# Patient Record
Sex: Female | Born: 1945 | Race: Black or African American | Hispanic: No | Marital: Married | Smoking: Never smoker
Health system: Southern US, Community
[De-identification: ages and names within clinical notes are randomized; demographics above are authoritative.]

## PROBLEM LIST (undated history)

## (undated) DIAGNOSIS — R9389 Abnormal findings on diagnostic imaging of other specified body structures: Secondary | ICD-10-CM

## (undated) DIAGNOSIS — E785 Hyperlipidemia, unspecified: Secondary | ICD-10-CM

## (undated) DIAGNOSIS — R918 Other nonspecific abnormal finding of lung field: Secondary | ICD-10-CM

## (undated) DIAGNOSIS — E042 Nontoxic multinodular goiter: Secondary | ICD-10-CM

## (undated) DIAGNOSIS — C50919 Malignant neoplasm of unspecified site of unspecified female breast: Secondary | ICD-10-CM

## (undated) DIAGNOSIS — R200 Anesthesia of skin: Secondary | ICD-10-CM

## (undated) DIAGNOSIS — I10 Essential (primary) hypertension: Secondary | ICD-10-CM

## (undated) DIAGNOSIS — M9979 Connective tissue and disc stenosis of intervertebral foramina of abdomen and other regions: Secondary | ICD-10-CM

## (undated) HISTORY — PX: ABDOMINAL HYSTERECTOMY: SHX81

## (undated) HISTORY — DX: Nontoxic multinodular goiter: E04.2

## (undated) HISTORY — DX: Anesthesia of skin: R20.0

## (undated) HISTORY — DX: Other nonspecific abnormal finding of lung field: R91.8

## (undated) HISTORY — DX: Abnormal findings on diagnostic imaging of other specified body structures: R93.89

## (undated) HISTORY — PX: FOOT ARTHRODESIS, MODIFIED MCBRIDE: SUR52

## (undated) HISTORY — DX: Connective tissue and disc stenosis of intervertebral foramina of abdomen and other regions: M99.79

## (undated) HISTORY — DX: Hyperlipidemia, unspecified: E78.5

## (undated) HISTORY — PX: COLONOSCOPY: SHX174

## (undated) HISTORY — DX: Essential (primary) hypertension: I10

## (undated) HISTORY — PX: POLYPECTOMY: SHX149

## (undated) HISTORY — DX: Malignant neoplasm of unspecified site of unspecified female breast: C50.919

---

## 1999-04-15 ENCOUNTER — Encounter: Payer: Self-pay | Admitting: Emergency Medicine

## 1999-04-15 ENCOUNTER — Emergency Department (HOSPITAL_COMMUNITY): Admission: EM | Admit: 1999-04-15 | Discharge: 1999-04-15 | Payer: Self-pay | Admitting: Emergency Medicine

## 1999-11-10 ENCOUNTER — Encounter: Payer: Self-pay | Admitting: Family Medicine

## 1999-11-10 ENCOUNTER — Ambulatory Visit (HOSPITAL_COMMUNITY): Admission: RE | Admit: 1999-11-10 | Discharge: 1999-11-10 | Payer: Self-pay | Admitting: Family Medicine

## 1999-11-15 ENCOUNTER — Encounter: Payer: Self-pay | Admitting: Family Medicine

## 1999-11-15 ENCOUNTER — Ambulatory Visit (HOSPITAL_COMMUNITY): Admission: RE | Admit: 1999-11-15 | Discharge: 1999-11-15 | Payer: Self-pay | Admitting: Family Medicine

## 1999-12-07 ENCOUNTER — Encounter (INDEPENDENT_AMBULATORY_CARE_PROVIDER_SITE_OTHER): Payer: Self-pay | Admitting: Specialist

## 1999-12-07 ENCOUNTER — Other Ambulatory Visit: Admission: RE | Admit: 1999-12-07 | Discharge: 1999-12-07 | Payer: Self-pay | Admitting: Obstetrics and Gynecology

## 2000-02-21 ENCOUNTER — Inpatient Hospital Stay (HOSPITAL_COMMUNITY): Admission: RE | Admit: 2000-02-21 | Discharge: 2000-02-24 | Payer: Self-pay | Admitting: Obstetrics and Gynecology

## 2000-02-21 ENCOUNTER — Encounter (INDEPENDENT_AMBULATORY_CARE_PROVIDER_SITE_OTHER): Payer: Self-pay | Admitting: Specialist

## 2000-12-06 ENCOUNTER — Encounter: Payer: Self-pay | Admitting: Obstetrics and Gynecology

## 2000-12-06 ENCOUNTER — Ambulatory Visit (HOSPITAL_COMMUNITY): Admission: RE | Admit: 2000-12-06 | Discharge: 2000-12-06 | Payer: Self-pay | Admitting: Obstetrics and Gynecology

## 2000-12-11 ENCOUNTER — Encounter: Payer: Self-pay | Admitting: Obstetrics and Gynecology

## 2000-12-11 ENCOUNTER — Encounter: Admission: RE | Admit: 2000-12-11 | Discharge: 2000-12-11 | Payer: Self-pay | Admitting: Obstetrics and Gynecology

## 2001-12-24 ENCOUNTER — Other Ambulatory Visit: Admission: RE | Admit: 2001-12-24 | Discharge: 2001-12-24 | Payer: Self-pay | Admitting: Obstetrics and Gynecology

## 2001-12-31 ENCOUNTER — Encounter: Payer: Self-pay | Admitting: Obstetrics and Gynecology

## 2001-12-31 ENCOUNTER — Ambulatory Visit (HOSPITAL_COMMUNITY): Admission: RE | Admit: 2001-12-31 | Discharge: 2001-12-31 | Payer: Self-pay | Admitting: Obstetrics and Gynecology

## 2002-12-27 ENCOUNTER — Other Ambulatory Visit: Admission: RE | Admit: 2002-12-27 | Discharge: 2002-12-27 | Payer: Self-pay | Admitting: Obstetrics and Gynecology

## 2003-01-02 ENCOUNTER — Ambulatory Visit (HOSPITAL_COMMUNITY): Admission: RE | Admit: 2003-01-02 | Discharge: 2003-01-02 | Payer: Self-pay | Admitting: Obstetrics and Gynecology

## 2003-01-02 ENCOUNTER — Encounter: Payer: Self-pay | Admitting: Obstetrics and Gynecology

## 2004-01-15 ENCOUNTER — Other Ambulatory Visit: Admission: RE | Admit: 2004-01-15 | Discharge: 2004-01-15 | Payer: Self-pay | Admitting: Obstetrics and Gynecology

## 2005-05-30 ENCOUNTER — Other Ambulatory Visit: Admission: RE | Admit: 2005-05-30 | Discharge: 2005-05-30 | Payer: Self-pay | Admitting: Obstetrics and Gynecology

## 2005-05-31 ENCOUNTER — Other Ambulatory Visit: Admission: RE | Admit: 2005-05-31 | Discharge: 2005-05-31 | Payer: Self-pay | Admitting: Obstetrics and Gynecology

## 2005-07-05 ENCOUNTER — Encounter: Admission: RE | Admit: 2005-07-05 | Discharge: 2005-07-05 | Payer: Self-pay | Admitting: Obstetrics and Gynecology

## 2005-07-27 ENCOUNTER — Encounter: Admission: RE | Admit: 2005-07-27 | Discharge: 2005-07-27 | Payer: Self-pay | Admitting: Obstetrics and Gynecology

## 2005-09-16 ENCOUNTER — Ambulatory Visit: Payer: Self-pay | Admitting: Gastroenterology

## 2005-09-30 ENCOUNTER — Ambulatory Visit: Payer: Self-pay | Admitting: Gastroenterology

## 2005-09-30 ENCOUNTER — Encounter (INDEPENDENT_AMBULATORY_CARE_PROVIDER_SITE_OTHER): Payer: Self-pay | Admitting: *Deleted

## 2006-07-07 ENCOUNTER — Encounter: Admission: RE | Admit: 2006-07-07 | Discharge: 2006-07-07 | Payer: Self-pay | Admitting: Obstetrics and Gynecology

## 2006-07-19 ENCOUNTER — Encounter: Admission: RE | Admit: 2006-07-19 | Discharge: 2006-07-19 | Payer: Self-pay | Admitting: Obstetrics and Gynecology

## 2006-08-04 ENCOUNTER — Other Ambulatory Visit: Admission: RE | Admit: 2006-08-04 | Discharge: 2006-08-04 | Payer: Self-pay | Admitting: Obstetrics and Gynecology

## 2007-08-20 ENCOUNTER — Encounter: Admission: RE | Admit: 2007-08-20 | Discharge: 2007-08-20 | Payer: Self-pay | Admitting: Obstetrics and Gynecology

## 2010-08-08 ENCOUNTER — Encounter: Payer: Self-pay | Admitting: Obstetrics and Gynecology

## 2010-11-22 ENCOUNTER — Encounter: Payer: Self-pay | Admitting: Gastroenterology

## 2010-11-22 ENCOUNTER — Ambulatory Visit (AMBULATORY_SURGERY_CENTER): Payer: Commercial Managed Care - PPO | Admitting: *Deleted

## 2010-11-22 VITALS — Ht 62.0 in | Wt 167.0 lb

## 2010-11-22 DIAGNOSIS — Z860100 Personal history of colon polyps, unspecified: Secondary | ICD-10-CM

## 2010-11-22 MED ORDER — PEG-KCL-NACL-NASULF-NA ASC-C 100 G PO SOLR: ORAL | Status: DC

## 2010-12-01 ENCOUNTER — Other Ambulatory Visit: Payer: Self-pay | Admitting: Adult Health Nurse Practitioner

## 2010-12-01 DIAGNOSIS — Z139 Encounter for screening, unspecified: Secondary | ICD-10-CM

## 2010-12-03 ENCOUNTER — Ambulatory Visit
Admission: RE | Admit: 2010-12-03 | Discharge: 2010-12-03 | Disposition: A | Discharge status: Home / Self Care | Payer: Commercial Managed Care - PPO | Source: Ambulatory Visit | Attending: Adult Health Nurse Practitioner | Admitting: Adult Health Nurse Practitioner

## 2010-12-03 DIAGNOSIS — Z139 Encounter for screening, unspecified: Secondary | ICD-10-CM

## 2010-12-03 NOTE — Op Note (Signed)
[facility]  Patient:    Pamela Small, Pamela Small                       MRN: [id number] Proc. Date: 02/21/00 Attending:  Fayrene Fearing A. Ashley Royalty, M.D.                           Operative Report  PREOPERATIVE DIAGNOSES: 1. Fibroid uterus. 2. Intermenstrual bleeding, menorrhagia secondary to #1. 3. Anemia--secondary to #1 and #2.  POSTOPERATIVE DIAGNOSES: 1. Fibroid uterus. 2. Intermenstrual bleeding, menorrhagia secondary to #1. 3. Anemia--secondary to #1 and #2.  PATH:  Pending.  PROCEDURE:  Total abdominal hysterectomy, bilateral salpingo-oophorectomy.  SURGEON:  Rudy Jew. Ashley Royalty, M.D.  ASSISTANT:  Altamese Dilling, M.D.  ANESTHESIA:  General.  ESTIMATED BLOOD LOSS:  1500 cc.  COMPLICATIONS:  None.  PACKS/DRAINS:  None.  DESCRIPTION OF PROCEDURE:  The patient was taken to the operating room and placed in the dorsal supine position. After adequate general anesthesia was administered she was placed in the frog leg position and prepped and draped in the usual manner for abdominal surgery including prepping of the vagina. A Foley catheter was placed. The patient was then returned to the supine position.  A Pfannenstiel incision was made down to the level of the fascia. The fascia was nicked with a knife and incised transversely with Mayo scissors. The underlying rectus muscles were separated from the overlying fascia using sharp and blunt dissection. The rectus muscles were separated in the midline exposing the peritoneum which was elevated and entered atraumatically with Metzenbaum scissors. The incision was extended longitudinally. 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 The adnexa appeared normal. Pelvic washings were obtained prior to further manipulation. It was submitted separately to pathology for cytologic studies.  The round ligaments were then clamped, cut and secured with #1 chromic catgut. A bladder flap was created anteriorly and the  bladder was sharply and bluntly dissected inferiorly out of harms way. Next, the broad ligament was further opened and the incision carried up to the level of the adnexa. The infundibulopelvic ligaments were bilaterally clamped, cut and doubly secured with #1 chromic catgut. Next, the uterine vessels were skeletonized and the uterine vessels were clamped, cut and doubly secured with #1 chromic catgut. At this point, the dissection could not be further carried out due to the massive fibroid uterus, hence the uterus was excised at the level of the corpus and submitted to pathology for histologic studies. Next, the cervix was grasped with Satinsky scissors and the dissection carried further down by taking serial bites with straight Heaney clamps. Each pedicle was clamped, cut and sutures with #1 chromic catgut. The vagina was successfully entered and the cervix was excised with Satinsky scissors. It was then submitted to pathology for histologic studies. The vaginal angles were secured with #1 chromic catgut. A running locking circumferential suture was placed on the cervix using 2-0 Vicryl to aid in hemostasis. The diameter was sufficiently small at the conclusion of the suture placement. No additional sutures were felt to be necessary to avoid any herniation. Copious irrigation was accomplished. Due to the difficulty with the dissection and the large fibroid uterus, the blood loss was considerably more than average. At the conclusion of the blood loss, the patient was given 1 unit of her autologous blood. Copious irrigation was accomplished and hemostasis noted. Next, the fascial defects were closed with #0 Vicryl in a running fashion. The skin was  closed with staples. The patient tolerated the procedure extremely well and was returned to the recovery room in good condition. DD:  02/22/00 TD:  02/22/00 Job: [id number]

## 2010-12-03 NOTE — Discharge Summary (Signed)
[facility]  Patient:    Pamela Small, Pamela Small                       MRN: [id number] Attending:  Rudy Jew. Ashley Royalty, M.D.                           Discharge Summary  HISTORY OF PRESENT ILLNESS:  This is a 65 year old gravida 2, para 1, Ab1, referred through the courtesy of Lilyan Punt. Sydnee Levans, M.D. at [facility].  Patient presented on or about Dec 03, 1999 complaining of intermenstrual bleeding for approximately one year as well as menorrhagia and frequent dyspareunia.  She had a GYN ultrasound at [facility] on November 15, 1999 which revealed a fibroid uterus, including one 5.6 cm in diameter and several others, the larger of which was 3.4 cm in greatest diameter.  The right ovary was not visualized; the left ovary was considered normal.  Subsequent endometrial biopsy was performed which revealed weakly proliferative endometrium with no hyperplasia or malignancy.  Patient was also subsequently diagnosed with anemia, with hemoglobin 11.4, hematocrit 36.6 and MCHC 31.1 and MCV 80.8.  She is hence for a total abdominal hysterectomy, bilateral salpingo-oophorectomy and indicated procedures.  MEDICATIONS:  None.  PAST MEDICAL HISTORY:  Negative.  PAST SURGICAL HISTORY:  Biopsy of the left breast some 20 years ago with benign pathology.  Surgery on foot 20 years ago.  ALLERGIES:  None.  FAMILY HISTORY:  Noncontributory.  SOCIAL HISTORY:  Patient denies the use of tobacco or significant alcohol.  REVIEW OF SYSTEMS:  Noncontributory.  PHYSICAL EXAMINATION:  GENERAL:  Well-developed, well-nourished, pleasant black female in no acute distress.  Afebrile.  VITAL SIGNS:  Vital signs stable.  SKIN:  Warm and dry without lesions.  LYMPH:  There is no supraclavicular, cervical or inguinal adenopathy.  HEENT:  Normocephalic.  NECK:  Supple without thyromegaly.  CHEST:  Lungs are clear.  CARDIAC:  Regular rate and rhythm, without  murmurs, gallops or rubs.  BREASTS:  Deferred.  ABDOMEN:  Soft and nontender, without masses or organomegaly.  PELVIC:  Examination deferred until examination under anesthesia.  IMPRESSION 1. Fibroid uterus. 2. Abnormal uterine bleeding -- secondary to #1. 3. Anemia -- probably secondary to #1 and #2.  PLAN:  Total abdominal hysterectomy, bilateral salpingo-oophorectomy and indicated procedures.  Risks, benefits, complications and alternatives were fully discussed with the patient.  Patient states she understands and accepts. Questions invited and answered. DD:  02/20/00 TD:  02/21/00 Job: [id number]

## 2010-12-03 NOTE — Discharge Summary (Signed)
[facility]  Patient:    Pamela Small, Pamela Small                       MRN: [id number] Adm. Date:  [id number] Disc. Date: [id number] Attending:  Wandalee Ferdinand                           Discharge Summary  DISCHARGE DIAGNOSES: 1. Fibroid uterus. 2. Anemia secondary to #1 plus intraoperative blood loss.  OPERATIONS AND SPECIAL PROCEDURES:  Total abdominal hysterectomy, bilateral salpingo-oophorectomy.  HOSPITAL COURSE:  The patient was admitted on the aforementioned date and underwent hysterectomy as stated.  She tolerated the procedure well.  Her postoperative course was uneventful, save for postoperative anemia.  She was given autologous blood intraoperatively as well as bank blood postoperatively and at the time of discharge hemoglobin was approximately 8.7.  DISCHARGE MEDICATIONS:  Estrace 1 mg daily, Tylox for pain and Clomagen daily for anemia.  DISPOSITION:  The patient is to return to [facility] approximately six weeks for postoperative follow-up. DD:  03/22/00 TD:  03/22/00 Job: [id number]

## 2010-12-06 ENCOUNTER — Encounter: Payer: Self-pay | Admitting: Gastroenterology

## 2010-12-06 ENCOUNTER — Other Ambulatory Visit: Payer: Self-pay | Admitting: Gastroenterology

## 2010-12-06 ENCOUNTER — Ambulatory Visit (AMBULATORY_SURGERY_CENTER): Payer: Commercial Managed Care - PPO | Admitting: Gastroenterology

## 2010-12-06 VITALS — BP 142/74 | HR 86 | Temp 97.2°F | Resp 20 | Ht 62.5 in | Wt 167.0 lb

## 2010-12-06 DIAGNOSIS — Z860100 Personal history of colon polyps, unspecified: Secondary | ICD-10-CM

## 2010-12-06 DIAGNOSIS — Z1211 Encounter for screening for malignant neoplasm of colon: Secondary | ICD-10-CM

## 2010-12-06 MED ORDER — SODIUM CHLORIDE 0.9 % IV SOLN: 500.0000 mL | INTRAVENOUS | Status: DC

## 2010-12-06 NOTE — Patient Instructions (Signed)
NORMAL COLONOSCOPY- REPEAT EXAM IN 10 YEARS, WE WILL MAIL YOU A LETTER TO REMIND YOU OF THIS  CONTINUE ALL MEDICINES AS DIRECTED

## 2010-12-07 ENCOUNTER — Telehealth: Payer: Self-pay

## 2010-12-07 NOTE — Telephone Encounter (Signed)
Left message on answering machine. 

## 2011-01-15 ENCOUNTER — Emergency Department (HOSPITAL_COMMUNITY)
Admission: EM | Admit: 2011-01-15 | Discharge: 2011-01-15 | Disposition: A | Discharge status: Home / Self Care | Payer: PRIVATE HEALTH INSURANCE | Attending: Emergency Medicine | Admitting: Emergency Medicine

## 2011-01-15 DIAGNOSIS — X19XXXA Contact with other heat and hot substances, initial encounter: Secondary | ICD-10-CM | POA: Insufficient documentation

## 2011-01-15 DIAGNOSIS — Y990 Civilian activity done for income or pay: Secondary | ICD-10-CM | POA: Insufficient documentation

## 2011-01-15 DIAGNOSIS — Y9269 Other specified industrial and construction area as the place of occurrence of the external cause: Secondary | ICD-10-CM | POA: Insufficient documentation

## 2011-01-15 DIAGNOSIS — T2122XA Burn of second degree of abdominal wall, initial encounter: Secondary | ICD-10-CM | POA: Insufficient documentation

## 2011-01-15 DIAGNOSIS — E7800 Pure hypercholesterolemia, unspecified: Secondary | ICD-10-CM | POA: Insufficient documentation

## 2012-05-10 ENCOUNTER — Ambulatory Visit (INDEPENDENT_AMBULATORY_CARE_PROVIDER_SITE_OTHER): Payer: Medicare Other | Admitting: Family Medicine

## 2012-05-10 ENCOUNTER — Emergency Department (HOSPITAL_COMMUNITY): Payer: Medicare Other

## 2012-05-10 ENCOUNTER — Encounter (HOSPITAL_COMMUNITY): Payer: Self-pay | Admitting: *Deleted

## 2012-05-10 ENCOUNTER — Ambulatory Visit: Payer: Medicare Other

## 2012-05-10 ENCOUNTER — Other Ambulatory Visit: Payer: Self-pay

## 2012-05-10 ENCOUNTER — Observation Stay (HOSPITAL_COMMUNITY)
Admission: EM | Admit: 2012-05-10 | Discharge: 2012-05-11 | Disposition: A | Discharge status: Home / Self Care | Payer: Medicare Other | Attending: Emergency Medicine | Admitting: Emergency Medicine

## 2012-05-10 VITALS — BP 148/82 | HR 81 | Temp 98.2°F | Resp 17 | Ht 65.0 in | Wt 164.0 lb

## 2012-05-10 DIAGNOSIS — I10 Essential (primary) hypertension: Secondary | ICD-10-CM | POA: Insufficient documentation

## 2012-05-10 DIAGNOSIS — J45909 Unspecified asthma, uncomplicated: Secondary | ICD-10-CM | POA: Insufficient documentation

## 2012-05-10 DIAGNOSIS — E785 Hyperlipidemia, unspecified: Secondary | ICD-10-CM

## 2012-05-10 DIAGNOSIS — Z862 Personal history of diseases of the blood and blood-forming organs and certain disorders involving the immune mechanism: Secondary | ICD-10-CM

## 2012-05-10 DIAGNOSIS — R079 Chest pain, unspecified: Secondary | ICD-10-CM

## 2012-05-10 DIAGNOSIS — Z79899 Other long term (current) drug therapy: Secondary | ICD-10-CM | POA: Insufficient documentation

## 2012-05-10 DIAGNOSIS — Z8639 Personal history of other endocrine, nutritional and metabolic disease: Secondary | ICD-10-CM

## 2012-05-10 LAB — TSH: TSH: 1.19 u[IU]/mL (ref 0.350–4.500)

## 2012-05-10 LAB — POCT CBC
Granulocyte percent: 65 % (ref 37–80)
HCT, POC: 49.3 % — AB (ref 37.7–47.9)
Hemoglobin: 14.9 g/dL (ref 12.2–16.2)
Lymph, poc: 2 (ref 0.6–3.4)
MCH, POC: 26.9 pg — AB (ref 27–31.2)
MCHC: 30.2 g/dL — AB (ref 31.8–35.4)
MCV: 89.2 fL (ref 80–97)
MID (cbc): 0.5 (ref 0–0.9)
MPV: 9.1 fL (ref 0–99.8)
POC Granulocyte: 4.7 (ref 2–6.9)
POC LYMPH PERCENT: 27.8 %L (ref 10–50)
POC MID %: 7.2 % (ref 0–12)
Platelet Count, POC: 342 10*3/uL (ref 142–424)
RBC: 5.53 M/uL — AB (ref 4.04–5.48)
RDW, POC: 15 %
WBC: 7.2 10*3/uL (ref 4.6–10.2)

## 2012-05-10 LAB — LIPID PANEL
Cholesterol: 347 mg/dL — ABNORMAL HIGH (ref 0–200)
HDL: 46 mg/dL (ref >39)
LDL Cholesterol: 276 mg/dL — ABNORMAL HIGH (ref 0–99)
Total CHOL/HDL Ratio: 7.5 Ratio
Triglycerides: 123 mg/dL (ref <150)
VLDL: 25 mg/dL (ref 0–40)

## 2012-05-10 LAB — CBC
MCHC: 34 g/dL (ref 30.0–36.0)
MCV: 84.5 fL (ref 78.0–100.0)
Platelets: 270 10*3/uL (ref 150–400)
RDW: 14.3 % (ref 11.5–15.5)
WBC: 7.6 10*3/uL (ref 4.0–10.5)

## 2012-05-10 LAB — TROPONIN I: Troponin I: <0.3 ng/mL (ref <0.30)

## 2012-05-10 LAB — COMPREHENSIVE METABOLIC PANEL WITH GFR
ALT: 15 U/L (ref 0–35)
Albumin: 4.4 g/dL (ref 3.5–5.2)
CO2: 28 meq/L (ref 19–32)
Calcium: 9.7 mg/dL (ref 8.4–10.5)
Chloride: 102 meq/L (ref 96–112)
Potassium: 4.6 meq/L (ref 3.5–5.3)
Sodium: 137 meq/L (ref 135–145)
Total Protein: 7.2 g/dL (ref 6.0–8.3)

## 2012-05-10 LAB — COMPREHENSIVE METABOLIC PANEL
AST: 19 U/L (ref 0–37)
Alkaline Phosphatase: 132 U/L — ABNORMAL HIGH (ref 39–117)
BUN: 16 mg/dL (ref 6–23)
Creat: 0.87 mg/dL (ref 0.50–1.10)
Glucose, Bld: 90 mg/dL (ref 70–99)
Total Bilirubin: 0.7 mg/dL (ref 0.3–1.2)

## 2012-05-10 LAB — BASIC METABOLIC PANEL
Chloride: 101 mEq/L (ref 96–112)
Creatinine, Ser: 0.92 mg/dL (ref 0.50–1.10)
GFR calc Af Amer: 74 mL/min — ABNORMAL LOW (ref >90)
GFR calc non Af Amer: 63 mL/min — ABNORMAL LOW (ref >90)
Potassium: 4.7 mEq/L (ref 3.5–5.1)

## 2012-05-10 LAB — POCT I-STAT TROPONIN I: Troponin i, poc: 0.01 ng/mL (ref 0.00–0.08)

## 2012-05-10 LAB — POCT GLYCOSYLATED HEMOGLOBIN (HGB A1C): Hemoglobin A1C: 5.7

## 2012-05-10 MED ORDER — ASPIRIN 325 MG PO TABS
325.0000 mg | ORAL_TABLET | Freq: Every day | ORAL | Status: DC
  Administered 2012-05-10: 325 mg via ORAL

## 2012-05-10 MED ORDER — ACETAMINOPHEN 325 MG PO TABS
650.0000 mg | ORAL_TABLET | Freq: Once | ORAL | Status: AC
  Administered 2012-05-10: 650 mg via ORAL
  Filled 2012-05-10: qty 2

## 2012-05-10 MED ORDER — ALBUTEROL SULFATE (5 MG/ML) 0.5% IN NEBU
5.0000 mg | INHALATION_SOLUTION | Freq: Once | RESPIRATORY_TRACT | Status: AC
  Administered 2012-05-10: 5 mg via RESPIRATORY_TRACT
  Filled 2012-05-10: qty 1

## 2012-05-10 NOTE — ED Provider Notes (Signed)
History     CSN: [id number]  Arrival date & time 05/10/12  1122   First MD Initiated Contact with Patient 05/10/12 1145      Chief Complaint  Patient presents with  . Chest Pain  . Hypertension    (Consider location/radiation/quality/duration/timing/severity/associated sxs/prior treatment) HPI Comments: Patient presents to the emergency department with complaint of elevated blood pressure for the past couple of weeks. Patient states she does not have a history of hypertension. She has a history of high cholesterol. Patient also states that she intermittently has short-lived episodes of sharp mid-chest pain that lasts for several seconds before resolving completely. In addition, she reports having chest pressure that is constant over her chest for the past week. She has had these for some time, most recently 2 days ago. This is not aggravated by activity or movement. Patient does not have any history of cardiac problems and has never had a stress test. Patient was seen by her primary care physician today for these complaints and was sent to the emergency department for further evaluation, specifically given CP and risk factors. Onset of chest pain is acute. Course is resolved. Nothing makes symptoms better or worse.  The history is provided by the patient.    Past Medical History  Diagnosis Date  . Hyperlipidemia   . Asthma     Past Surgical History  Procedure Date  . Abdominal hysterectomy   . Colonoscopy   . Polypectomy   . Foot arthrodesis, modified mcbride     Family History  Problem Relation Age of Onset  . Breast cancer Daughter   . Hyperlipidemia Mother   . Diabetes Maternal Grandmother     History  Substance Use Topics  . Smoking status: Never Smoker   . Smokeless tobacco: Not on file  . Alcohol Use: No    OB History    Grav Para Term Preterm Abortions TAB SAB Ect Mult Living                  Review of Systems  Constitutional: Negative for fever and  diaphoresis.  HENT: Negative for neck pain.   Eyes: Negative for redness.  Respiratory: Positive for cough, shortness of breath and wheezing.   Cardiovascular: Positive for chest pain. Negative for palpitations and leg swelling.  Gastrointestinal: Negative for nausea, vomiting and abdominal pain.  Genitourinary: Negative for dysuria.  Musculoskeletal: Negative for back pain.  Skin: Negative for rash.  Neurological: Negative for syncope and light-headedness.    Allergies  Review of patient's allergies indicates no known allergies.  Home Medications   Current Outpatient Rx  Name Route Sig Dispense Refill  . PEG-KCL-NACL-NASULF-NA ASC-C 100 G PO SOLR  Moviprep-Take as directed 1 kit 0    Dispense as written.  Marland Kitchen ROSUVASTATIN CALCIUM 20 MG PO TABS Oral Take 20 mg by mouth daily.        BP 128/81  Pulse 77  Temp 98.1 F (36.7 C) (Oral)  Resp 16  Ht 5' 2.5" (1.588 m)  Wt 164 lb (74.39 kg)  BMI 29.52 kg/m2  SpO2 100%  Physical Exam  Nursing note and vitals reviewed. Constitutional: She appears well-developed and well-nourished.  HENT:  Head: Normocephalic and atraumatic.  Mouth/Throat: Mucous membranes are normal. Mucous membranes are not dry.  Eyes: Conjunctivae normal are normal.  Neck: Trachea normal and normal range of motion. Neck supple. Normal carotid pulses and no JVD present. No muscular tenderness present. Carotid bruit is not present. No tracheal deviation present.  Cardiovascular: Normal rate, regular rhythm, S1 normal, S2 normal, normal heart sounds and intact distal pulses.  Exam reveals no decreased pulses.   No murmur heard. Pulmonary/Chest: Effort normal. No respiratory distress. She has wheezes (Moderate exp all fields. ). She exhibits no tenderness.  Abdominal: Soft. Normal aorta and bowel sounds are normal. There is no tenderness. There is no rebound and no guarding.  Musculoskeletal: Normal range of motion.  Neurological: She is alert.  Skin: Skin is warm  and dry. She is not diaphoretic. No cyanosis. No pallor.  Psychiatric: She has a normal mood and affect.    ED Course  Procedures (including critical care time)  Labs Reviewed  CBC - Abnormal; Notable for the following:    RBC 5.22 (*)     All other components within normal limits  BASIC METABOLIC PANEL - Abnormal; Notable for the following:    GFR calc non Af Amer 63 (*)     GFR calc Af Amer 74 (*)     All other components within normal limits  POCT I-STAT TROPONIN I      1. Chest pain     11:57 AM Patient seen and examined. Work-up initiated. Patient has received ASA. I have reviewed Dr. Stevenson Clinch note from earlier today.    Vital signs reviewed and are as follows: Filed Vitals:   05/10/12 1131  BP: 128/81  Pulse: 77  Temp: 98.1 F (36.7 C)  Resp: 16   1:54 PM Patient discussed with Dr. Lorenso Courier who will see patient.   3:15 PM Dr. Lorenso Courier has seen. Will place on CDU CPP. Patient is agreeable. Handoff to Muthersbaugh PA-C in CDU.    Date: 05/10/2012  Rate: 73  Rhythm: normal sinus rhythm  QRS Axis: normal  Intervals: normal  ST/T Wave abnormalities: nonspecific T wave changes  Conduction Disutrbances:none  Narrative Interpretation:   Old EKG Reviewed: changes noted from 02/17/2000 lateral t-waves flat today    MDM  CDU CPP.         Renne Crigler, [place] 05/10/12 [phone]

## 2012-05-10 NOTE — Patient Instructions (Addendum)
Rejection of Medical Treatment, Against Medical Advice Medical examination, treatment, or testing has been recommended for me. I have decided to reject further treatment or medical evaluation, and will leave the facility. I am rejecting medical care of my own choice, and contrary to the instructions and wishes of __________________________________________________, the treating physician. I understand that permanent harm, or even death, can occur from failing to follow the recommendations of the physician. I have had an opportunity to ask questions and fully understand my medical condition. I have been advised that I may return at any time to continue medical evaluation or treatment.  Because I am rejecting recommended medical care, I agree to absolve and release the physician and the medical facility from any and all liabilities for damages arising from any current medical condition. I accept all risk associated with my medical condition, both known and unknown. I assume all responsibility for this action. I agree that I will make no claim of any nature against this medical facility or the physician under any circumstances. __________________________________________________ Name of Patient __________________________________________________ Signature of Patient or Guardian of Same __________________________________________________ Date Document Released: 07/04/2005 Document Revised: 09/26/2011 Document Reviewed: 02/21/2008 [facility] Patient Information 2013 [place], [place].

## 2012-05-10 NOTE — ED Notes (Signed)
Patient states she has had elevated bp for 2 weeks.  She has had chest pain,  Worse in the past 2 days.  She also reports neck pain and left arm pain.  Patient reports she has cough and sob as well with wheezing

## 2012-05-10 NOTE — Progress Notes (Signed)
[facility]:  Office Visit  Chief Complaint:  Chief Complaint  Patient presents with  . Hypertension  . Headache  . Chest Pain    HPI: Pamela Small is a 66 y.o. female who complains of 2 week h/o elevated BP as high as 197/94 and as low as 144/80 at home. She does not have a h/o HTN. She had 2 episodes of CP during this 2 week period while doing house work. She has had 2 episodes of left anterior substernal CP that was on left side with  left arm numbness, and stiff neck, and HA. She denies jaw claudication, denies vision problems, denies fevers, nausea and vomiting, SOB, abdominal pain, GERD. CP lasted several minutes, and then felt better after she sat down. She has a h/o poorly controlled hyperlipidemia. Last took medication for hyperlipidemia several months ago. Last episode of CP was 2 nights ago. She has a history of poorly controlled asthma as well. She has been wheezing,coughing a lot during these past 2 weeks. Ran out of albutero. Currently she is chest pain free. Mother with h/o heart disease s/p CHF and stent placement age 86.   Past Medical History  Diagnosis Date  . Hyperlipidemia   . Asthma    Past Surgical History  Procedure Date  . Abdominal hysterectomy   . Colonoscopy   . Polypectomy   . Foot arthrodesis, modified mcbride    History   Social History  . Marital Status: Married    Spouse Name: N/A    Number of Children: N/A  . Years of Education: N/A   Social History Main Topics  . Smoking status: Never Smoker   . Smokeless tobacco: None  . Alcohol Use: No  . Drug Use: No  . Sexually Active: None   Other Topics Concern  . None   Social History Narrative  . None   Family History  Problem Relation Age of Onset  . Breast cancer Daughter   . Hyperlipidemia Mother   . Diabetes Maternal Grandmother    No Known Allergies Prior to Admission medications   Medication Sig Start Date End Date Taking? Authorizing Provider  peg 3350  powder (MOVIPREP) 100 G SOLR Moviprep-Take as directed 11/22/10   Louis Meckel, MD  rosuvastatin (CRESTOR) 20 MG tablet Take 20 mg by mouth daily.      Historical Provider, MD     ROS: The patient denies fevers, night sweats, unintentional weight loss,  nausea, vomiting, abdominal pain, dysuria, hematuria, melena, weakness.  All other systems have been reviewed and were otherwise negative with the exception of those mentioned in the HPI and as above.    PHYSICAL EXAM: Filed Vitals:   05/10/12 0903  BP: 148/82  Pulse: 81  Temp: 98.2 F (36.8 C)  Resp: 17   Filed Vitals:   05/10/12 0903  Height: 5\' 5"  (1.651 m)  Weight: 164 lb (74.39 kg)   Body mass index is 27.29 kg/(m^2).  General: Alert, no acute distress HEENT:  Normocephalic, atraumatic, oropharynx patent. EOMI, PERRLA.  Cardiovascular:  Regular rate and rhythm, no rubs murmurs or gallops.  No Carotid bruits, radial pulse intact. No pedal edema.  Respiratory: Clear to auscultation bilaterally.  No wheezes, rales, or rhonchi.  No cyanosis, no use of accessory musculature GI: No organomegaly, abdomen is soft and non-tender, positive bowel sounds.  No masses. Skin: No rashes. Neurologic: Facial musculature symmetric. Psychiatric: Patient is appropriate throughout our interaction. Lymphatic: No cervical lymphadenopathy Musculoskeletal:  Gait intact.   LABS: Results for orders placed in visit on 05/10/12  POCT CBC      Component Value Range   WBC 7.2  4.6 - 10.2 K/uL   Lymph, poc 2.0  0.6 - 3.4   POC LYMPH PERCENT 27.8  10 - 50 %L   MID (cbc) 0.5  0 - 0.9   POC MID % 7.2  0 - 12 %M   POC Granulocyte 4.7  2 - 6.9   Granulocyte percent 65.0  37 - 80 %G   RBC 5.53 (*) 4.04 - 5.48 M/uL   Hemoglobin 14.9  12.2 - 16.2 g/dL   HCT, POC 04.5 (*) 40.9 - 47.9 %   MCV 89.2  80 - 97 fL   MCH, POC 26.9 (*) 27 - 31.2 pg   MCHC 30.2 (*) 31.8 - 35.4 g/dL   RDW, POC 81.1     Platelet Count, POC 342  142 - 424 K/uL   MPV 9.1  0 -  99.8 fL  POCT GLYCOSYLATED HEMOGLOBIN (HGB A1C)      Component Value Range   Hemoglobin A1C 5.7       EKG/XRAY:   Primary read interpreted by Dr. Conley Rolls at [facility]. No acute cardiopulmonary process.   ASSESSMENT/PLAN: Encounter Diagnoses  Name Primary?  . Chest pain Yes  . Hyperlipidemia   . History of diabetes mellitus    Exertional chest pain -cardiac in etiology? vs possible related to asthma but has enough risk factors needing evaluation.  Patient initially declined going to ER, explained risk and benefits. After speaking with her again and husband was present for last portion of visit they decided to go to [facility] ER . Patient given  ASA 325 mg in office. She will go by personal car and did not want to go by ambulance, risk and benefits explained to her. She will go now straight to ER for evaluation. I went ahead and got labs ie lipids, troponin, TSH and CMP in office.   Her former cardiologist was Dr. Annamary Carolin at [facility].   Spoke with [facility] nurse about patient going by private transportation by car.    Kramer Hanrahan PHUONG, DO 05/10/2012 11:13 AM

## 2012-05-10 NOTE — Progress Notes (Signed)
Utilization review completed.  

## 2012-05-10 NOTE — ED Provider Notes (Signed)
Patient moved to CDU under chest pain protocol. Patient resting comfortably at present without return of chest pain. Lungs with mild expiratory wheezing throughout, no rales or rhonchi. S1/S2, RRR, no murmur. Abdomen soft, bowel sounds present. Strong distal pulses palpated all extremities. Sinus rhythm on monitor without ectopy. Troponin negative x 2. Awaiting 3rd delta troponin at 1900.  12 lead reviewed, no indication of ischemia. Patient scheduled for Cardiac CT in AM. Diagnostic and treatment plan discussed with patient and she is amenable to plan.  I have ordered albuterol 5mg  for wheezing as patient has a Hx of asthma and has not had her medications today.    5:20 PM  Pt resting comfortably, in NAD.  Breath sounds clear and equal.  Pt reports resolution of wheezing.  Pt. C/o mild headache.  Will administer tylenol.  3rd Troponin remains negative.  Pt denies return of the chest pain or heaviness.    7:40 PM   Note: At D/C please prescribe an albuterol MDI with spacer.  Pt states she is supposed to be taking this, but does not because it coats her tongue when she uses the MDI.     Dahlia Client Ogle Hoeffner, PA-C 05/10/12 [phone]

## 2012-05-10 NOTE — ED Provider Notes (Signed)
Medical screening examination/treatment/procedure(s) were conducted as a shared visit with non-physician practitioner(s) and myself.  I personally evaluated the patient during the encounter.  Obtained H&P.  Pt's describes a transient chest pain that does not fit the classic story of heart related chest pain.  By [employer] criteria she is low risk for PE.  She does have several cardiac risk factors.  Plan is to place her on the CDU chest pain protocol.  Tobin Chad, MD 05/10/12 1725

## 2012-05-10 NOTE — ED Notes (Signed)
Care transferred and report given to Ginnie, RN 

## 2012-05-11 ENCOUNTER — Other Ambulatory Visit: Payer: Self-pay

## 2012-05-11 ENCOUNTER — Observation Stay (HOSPITAL_COMMUNITY): Payer: Medicare Other

## 2012-05-11 MED ORDER — METOPROLOL TARTRATE 1 MG/ML IV SOLN
INTRAVENOUS | Status: AC
  Filled 2012-05-11: qty 5

## 2012-05-11 MED ORDER — METOPROLOL TARTRATE 1 MG/ML IV SOLN
5.0000 mg | Freq: Once | INTRAVENOUS | Status: AC
  Administered 2012-05-11: 5 mg via INTRAVENOUS
  Filled 2012-05-11: qty 5

## 2012-05-11 MED ORDER — METOPROLOL TARTRATE 25 MG PO TABS
100.0000 mg | ORAL_TABLET | Freq: Once | ORAL | Status: AC
  Administered 2012-05-11: 100 mg via ORAL
  Filled 2012-05-11: qty 4

## 2012-05-11 MED ORDER — NITROGLYCERIN 0.4 MG SL SUBL
0.4000 mg | SUBLINGUAL_TABLET | Freq: Once | SUBLINGUAL | Status: AC
  Administered 2012-05-11: 0.4 mg via SUBLINGUAL

## 2012-05-11 MED ORDER — IOHEXOL 350 MG/ML SOLN
80.0000 mL | Freq: Once | INTRAVENOUS | Status: AC | PRN
  Administered 2012-05-11: 80 mL via INTRAVENOUS

## 2012-05-11 MED ORDER — METOPROLOL TARTRATE 1 MG/ML IV SOLN
5.0000 mg | Freq: Once | INTRAVENOUS | Status: AC
  Administered 2012-05-11: 5 mg via INTRAVENOUS

## 2012-05-11 MED ORDER — METOPROLOL TARTRATE 1 MG/ML IV SOLN
INTRAVENOUS | Status: AC
  Administered 2012-05-11: 5 mg via INTRAVENOUS
  Filled 2012-05-11: qty 5

## 2012-05-11 MED ORDER — NITROGLYCERIN 0.4 MG SL SUBL
SUBLINGUAL_TABLET | SUBLINGUAL | Status: AC
  Administered 2012-05-11: 0.4 mg via SUBLINGUAL
  Filled 2012-05-11: qty 25

## 2012-05-11 MED ORDER — METOPROLOL TARTRATE 1 MG/ML IV SOLN
INTRAVENOUS | Status: AC
  Filled 2012-05-11: qty 15

## 2012-05-11 NOTE — ED Notes (Signed)
Family at bedside. 

## 2012-05-11 NOTE — ED Notes (Signed)
Spoke with dr Joette Catching about pt heart rate. Orders received.

## 2012-05-11 NOTE — ED Provider Notes (Signed)
8:15: Patient is resting comfortably. In CDU on CPP, awaiting CT. Delay in metoprolol administration. No chest pain during the night or currently.   11:00 - still no pain. Resting comfortably.  1:30 - Dr. Llana Aliment called with report of mild CAD, non-obstructing, calc. Score of 58 (79th percentile). Discussed with Dr. Tenny Craw who will arrange office follow up with [facility] cardiology outpatient.  Rodena Medin, PA-C 05/11/12 1418

## 2012-05-11 NOTE — ED Notes (Signed)
bmi 29.8

## 2012-05-11 NOTE — ED Notes (Signed)
NPO now

## 2012-05-11 NOTE — ED Provider Notes (Signed)
Medical screening examination/treatment/procedure(s) were performed by non-physician practitioner and as supervising physician I was immediately available for consultation/collaboration.  Doug Sou, MD 05/11/12 2047

## 2012-05-11 NOTE — ED Provider Notes (Signed)
Medical screening examination/treatment/procedure(s) were performed by non-physician practitioner and as supervising physician I was immediately available for consultation/collaboration.   Trachelle Low H Khalidah Herbold, MD 05/11/12 1739 

## 2012-05-11 NOTE — ED Notes (Signed)
Patient is resting comfortably. 

## 2012-05-18 ENCOUNTER — Telehealth: Payer: Self-pay

## 2012-05-18 ENCOUNTER — Other Ambulatory Visit: Payer: Self-pay | Admitting: Family Medicine

## 2012-05-18 DIAGNOSIS — J9801 Acute bronchospasm: Secondary | ICD-10-CM

## 2012-05-18 DIAGNOSIS — I10 Essential (primary) hypertension: Secondary | ICD-10-CM

## 2012-05-18 DIAGNOSIS — E785 Hyperlipidemia, unspecified: Secondary | ICD-10-CM

## 2012-05-18 MED ORDER — ALBUTEROL SULFATE HFA 108 (90 BASE) MCG/ACT IN AERS: 2.0000 | INHALATION_SPRAY | Freq: Four times a day (QID) | RESPIRATORY_TRACT | Status: DC | PRN

## 2012-05-18 MED ORDER — ROSUVASTATIN CALCIUM 40 MG PO TABS: 40.0000 mg | ORAL_TABLET | Freq: Every day | ORAL | Status: DC

## 2012-05-18 MED ORDER — CHLORTHALIDONE 15 MG PO TABS: 15.0000 mg | ORAL_TABLET | Freq: Every day | ORAL | Status: DC

## 2012-05-18 NOTE — Telephone Encounter (Signed)
Spoke with patient regarding meds, labs. F/u in 1 month with BP cuff.

## 2012-05-18 NOTE — Telephone Encounter (Signed)
Spoke with pt, she states Dr Conley Rolls was supposed to send in her blood pressure medication the day she was in the office. She is at [facility] pharmacy and her medication is not there. Dr Conley Rolls please advise what BP med you would like pt to take.

## 2012-05-25 DIAGNOSIS — E785 Hyperlipidemia, unspecified: Secondary | ICD-10-CM | POA: Insufficient documentation

## 2012-05-27 ENCOUNTER — Encounter: Payer: Self-pay | Admitting: Family Medicine

## 2012-05-27 ENCOUNTER — Telehealth: Payer: Self-pay | Admitting: Physician Assistant

## 2012-05-27 MED ORDER — CHLORTHALIDONE 25 MG PO TABS: 12.5000 mg | ORAL_TABLET | Freq: Every day | ORAL | Status: DC

## 2012-05-27 NOTE — Telephone Encounter (Signed)
Received fax from [facility] pharmacy that chlorthalidone is only available in 25mg  or 50mg ; have changed her to 25mg  tabs with instructions to take one half tab daily

## 2012-06-04 ENCOUNTER — Encounter: Payer: Self-pay | Admitting: *Deleted

## 2012-06-04 ENCOUNTER — Encounter: Payer: Self-pay | Admitting: Cardiovascular Disease

## 2012-06-04 ENCOUNTER — Ambulatory Visit (INDEPENDENT_AMBULATORY_CARE_PROVIDER_SITE_OTHER): Payer: Medicare Other | Admitting: Cardiovascular Disease

## 2012-06-04 VITALS — BP 143/90 | HR 75 | Ht 62.5 in | Wt 166.0 lb

## 2012-06-04 DIAGNOSIS — R0600 Dyspnea, unspecified: Secondary | ICD-10-CM

## 2012-06-04 DIAGNOSIS — R0609 Other forms of dyspnea: Secondary | ICD-10-CM

## 2012-06-04 DIAGNOSIS — R079 Chest pain, unspecified: Secondary | ICD-10-CM | POA: Insufficient documentation

## 2012-06-04 DIAGNOSIS — J45909 Unspecified asthma, uncomplicated: Secondary | ICD-10-CM | POA: Insufficient documentation

## 2012-06-04 DIAGNOSIS — J449 Chronic obstructive pulmonary disease, unspecified: Secondary | ICD-10-CM

## 2012-06-04 DIAGNOSIS — R0989 Other specified symptoms and signs involving the circulatory and respiratory systems: Secondary | ICD-10-CM

## 2012-06-04 DIAGNOSIS — E785 Hyperlipidemia, unspecified: Secondary | ICD-10-CM

## 2012-06-04 DIAGNOSIS — I10 Essential (primary) hypertension: Secondary | ICD-10-CM | POA: Insufficient documentation

## 2012-06-04 MED ORDER — AMLODIPINE BESYLATE 5 MG PO TABS: 5.0000 mg | ORAL_TABLET | Freq: Every day | ORAL | Status: DC

## 2012-06-04 NOTE — Assessment & Plan Note (Signed)
Abnormal exam and CT with bronchiolitis and only an albuterol inhaler that she doesn't use well.  Refer to pulmonary

## 2012-06-04 NOTE — Patient Instructions (Addendum)
Your physician recommends that you schedule a follow-up appointment in: AS NEEDED Your physician has recommended you make the following change in your medication:  START AMLODIPIINE 5 MG 1 EVERY DAY  You have been referred to PULMONARY  DX COPD

## 2012-06-04 NOTE — Progress Notes (Signed)
Patient ID: Pamela Small, female   DOB: [birth year], 66 y.o.   MRN: [id number] Pamela Small is a 66 y.o. female who complains of 2 week h/o elevated BP as high as 197/94 and as low as 144/80 at home. She does not have a h/o HTN. She had 2 episodes of CP during this 2 week period while doing house work. She has had 2 episodes of left anterior substernal CP that was on left side with left arm numbness, and stiff neck, and HA. She denies jaw claudication, denies vision problems, denies fevers, nausea and vomiting, SOB, abdominal pain, GERD. CP lasted several minutes, and then felt better after she sat down. She has a h/o poorly controlled hyperlipidemia. Last took medication for hyperlipidemia several months ago. Last episode of CP was 2 nights ago. She has a history of poorly controlled asthma as well. She has been wheezing,coughing a lot during these past 2 weeks. Ran out of albutero. Currently she is chest pain free. Mother with h/o heart disease s/p CHF and stent placement age 37.  Had cardiac CT 10/24 with no CAD  Abnormal lung fields and fairly low calcium score in 50's  IMPRESSION:  1. Mild nonobstructive coronary artery disease, as detailed above. The patient's total coronary artery calcium score is 58 which is the 79th percentile for patient's of matched age, sex and race/ethnicity. 2. Multiple tiny pulmonary nodules scattered throughout the periphery of the lungs bilaterally are favored to reflect areas of mucoid impaction within terminal bronchioles. The largest of these measures up to 3 mm. If the patient is at high risk for bronchogenic carcinoma, follow-up chest CT at 1 year is recommended. If the patient is at low risk, no follow-up is needed. This recommendation follows the consensus statement: Guidelines for Management of Small Pulmonary Nodules Detected on CT Scans: A Statement from the Fleischner Society as published in Radiology 2005; 237:395-400. 3. Codominance of the  coronary arteries.    ROS: Denies fever, malais, weight loss, blurry vision, decreased visual acuity, cough, sputum, SOB, hemoptysis, pleuritic pain, palpitaitons, heartburn, abdominal pain, melena, lower extremity edema, claudication, or rash.  All other systems reviewed and negative   General: Affect appropriate Pleasant black female HEENT: normal Neck supple with no adenopathy JVP normal no bruits no thyromegaly Lungs bilateral rhonchi and  wheezing and good diaphragmatic motion Heart:  S1/S2 no murmur,rub, gallop or click PMI normal Abdomen: benighn, BS positve, no tenderness, no AAA no bruit.  No HSM or HJR Distal pulses intact with no bruits No edema Neuro non-focal Skin warm and dry No muscular weakness  Medications Current Outpatient Prescriptions  Medication Sig Dispense Refill  . albuterol (PROVENTIL HFA;VENTOLIN HFA) 108 (90 BASE) MCG/ACT inhaler Inhale 2 puffs into the lungs every 6 (six) hours as needed for wheezing.  1 Inhaler  5  . chlorthalidone (HYGROTON) 25 MG tablet Take 0.5 tablets (12.5 mg total) by mouth daily.  30 tablet  3  . rosuvastatin (CRESTOR) 40 MG tablet Take 1 tablet (40 mg total) by mouth daily. I changed your dosage because your cholesterol was still too high.  30 tablet  5   Current Facility-Administered Medications  Medication Dose Route Frequency Provider Last Rate Last Dose  . 0.9 %  sodium chloride infusion  500 mL Intravenous Continuous Louis Meckel, MD        Allergies Review of patient's allergies indicates no known allergies.  Family History: Family History  Problem Relation Age of Onset  . Breast  cancer Daughter   . Hyperlipidemia Mother   . Diabetes Maternal Grandmother     Social History: History   Social History  . Marital Status: Married    Spouse Name: N/A    Number of Children: N/A  . Years of Education: N/A   Occupational History  . Not on file.   Social History Main Topics  . Smoking status: Never  Smoker   . Smokeless tobacco: Not on file  . Alcohol Use: No  . Drug Use: No  . Sexually Active: Not on file   Other Topics Concern  . Not on file   Social History Narrative  . No narrative on file    Electrocardiogram:  05/11/12  SR rate 74 normal ECG  Assessment and Plan

## 2012-06-04 NOTE — Assessment & Plan Note (Signed)
Atypical related to wheezing and breathing issues.  Cardiac CT with no obstructive CAD

## 2012-06-04 NOTE — Assessment & Plan Note (Signed)
Cholesterol is not at goal.  Will need meds titrated by primary . Lab Results  Component Value Date   LDLCALC 276* 05/10/2012

## 2012-06-06 ENCOUNTER — Encounter: Payer: Self-pay | Admitting: Pulmonary Disease

## 2012-06-06 ENCOUNTER — Ambulatory Visit (INDEPENDENT_AMBULATORY_CARE_PROVIDER_SITE_OTHER): Payer: Medicare Other | Admitting: Pulmonary Disease

## 2012-06-06 VITALS — BP 132/84 | HR 82 | Temp 98.2°F | Ht 61.0 in | Wt 164.8 lb

## 2012-06-06 DIAGNOSIS — R918 Other nonspecific abnormal finding of lung field: Secondary | ICD-10-CM | POA: Insufficient documentation

## 2012-06-06 DIAGNOSIS — R062 Wheezing: Secondary | ICD-10-CM

## 2012-06-06 NOTE — Assessment & Plan Note (Signed)
The patient has 1-3 mm pulmonary nodules noted on her recent cardiac CT, and with a negative smoking history and no personal history of cancer, she needs no further followup by Fleischner criteria

## 2012-06-06 NOTE — Patient Instructions (Addendum)
Your noises are coming from your throat area and not the lungs.  I would like to schedule for scan of neck to see if anything is compressing your breathing passage, and also breathing tests to evaluate your upper and lower airway. Will call you with results once available.

## 2012-06-06 NOTE — Progress Notes (Signed)
  Subjective:    Patient ID: Pamela Small, female    DOB: 01/21/1946, 65 y.o.   MRN: [id number]  HPI The patient is a 66 year old female who I have been asked to see for wheezing.  The patient was told approximately one year ago that she had asthma after hearing wheezing, but the patient describes this as a classic audible upper airway pseudo-wheezing.  The noise can occur at any time during the day, but is worse upon lying down at night.  She has been started on albuterol as needed, but has not seen a big change with the medication.  She has never had pulmonary function studies.  She states that she has reasonable exertional tolerance unless she tries to walk up a large hill or multiple flights of stairs.  She is able to bring groceries in from the car unless she is taking more than 2 bags at a time.  However, she can walk on flat ground at a moderate pace for a mile or more without getting winded.  The patient denies any postnasal drip, but does have reflux symptoms for which she takes a proton pump inhibitor as needed.  She is also been newly diagnosed as having hypertension.  A recent chest x-ray showed no acute process, and minimal elevation of the right hemidiaphragm.  She has also had a cardiac CT that did demonstrate bilateral nodules 1-3 mm in size.  She has no smoking history, and no personal history of cancer.   Review of Systems  Constitutional: Negative for fever and unexpected weight change.  HENT: Positive for congestion. Negative for ear pain, nosebleeds, sore throat, rhinorrhea, sneezing, trouble swallowing, dental problem, postnasal drip and sinus pressure.   Eyes: Negative for redness and itching.  Respiratory: Positive for cough ( dry hacky cough.sometimes thick and white ), chest tightness, shortness of breath and wheezing.   Cardiovascular: Negative for palpitations and leg swelling.  Gastrointestinal: Negative for nausea and vomiting.  Genitourinary: Negative for dysuria.    Musculoskeletal: Negative for joint swelling.  Skin: Negative for rash.  Neurological: Positive for headaches.  Hematological: Bruises/bleeds easily.  Psychiatric/Behavioral: Negative for dysphoric mood. The patient is not nervous/anxious.        Objective:   Physical Exam Constitutional:  Well developed, no acute distress  HENT:  Nares patent without discharge  Oropharynx without exudate, palate and uvula are normal  Eyes:  Perrla, eomi, no scleral icterus  Neck:  No JVD, no TMG  Cardiovascular:  Normal rate, regular rhythm, no rubs or gallops.  No murmurs        Intact distal pulses  Pulmonary :  Prominent upper airway pseudowheezing on inspir and expir, no stridor    No rales, rhonchi, or wheezing.  Transmitted pseudowheezing to lower airways  Abdominal:  Soft, nondistended, bowel sounds present.  No tenderness noted.   Musculoskeletal:  No lower extremity edema noted.  Lymph Nodes:  No cervical lymphadenopathy noted  Skin:  No cyanosis noted  Neurologic:  Alert, appropriate, moves all 4 extremities without obvious deficit.         Assessment & Plan:

## 2012-06-06 NOTE — Assessment & Plan Note (Signed)
The patient has classic upper airway pseudo-wheezing, and it is unclear if this is a functional issue or a physical abnormality.  She does have prominent tissue in her neck on my exam, and it is unclear whether this is an enlarged thyroid, or possibly lymphadenopathy.  There is nothing on exam or history to suggest asthma.  I think she needs to have a CT of her neck to make sure there is not an anatomical reason for her symptoms, and if not, I would consider a functional issue possibly related to reflux disease.  She would also need an ENT evaluation to examine her vocal cord function.  However also like to check pulmonary function studies to look at her flow volume loop, and also to verify that her flows are normal.

## 2012-06-08 ENCOUNTER — Ambulatory Visit (INDEPENDENT_AMBULATORY_CARE_PROVIDER_SITE_OTHER)
Admission: RE | Admit: 2012-06-08 | Discharge: 2012-06-08 | Disposition: A | Discharge status: Home / Self Care | Payer: Medicare Other | Source: Ambulatory Visit | Attending: Pulmonary Disease | Admitting: Pulmonary Disease

## 2012-06-08 DIAGNOSIS — R062 Wheezing: Secondary | ICD-10-CM

## 2012-06-08 MED ORDER — IOHEXOL 300 MG/ML  SOLN
76.0000 mL | Freq: Once | INTRAMUSCULAR | Status: AC | PRN
  Administered 2012-06-08: 76 mL via INTRAVENOUS

## 2012-06-11 ENCOUNTER — Other Ambulatory Visit: Payer: Self-pay | Admitting: Pulmonary Disease

## 2012-06-11 DIAGNOSIS — R062 Wheezing: Secondary | ICD-10-CM

## 2012-06-12 ENCOUNTER — Telehealth: Payer: Self-pay | Admitting: Radiology

## 2012-06-12 ENCOUNTER — Telehealth: Payer: Self-pay | Admitting: Pulmonary Disease

## 2012-06-12 ENCOUNTER — Encounter: Payer: Self-pay | Admitting: Radiology

## 2012-06-12 DIAGNOSIS — E041 Nontoxic single thyroid nodule: Secondary | ICD-10-CM | POA: Insufficient documentation

## 2012-06-12 NOTE — Telephone Encounter (Signed)
Will forward to Dr. Shelle Iron so he is aware. Nothing further is needed at this time.

## 2012-06-12 NOTE — Telephone Encounter (Signed)
Pamela Small has the ct scan and was supposed to fax to the pt's primary md.  Will then need to call that office, speak directly with the MD's nurse, and verify that she received.  Then document accordingly in computer.  Pamela Small has the ct report with my notes on them to the primary MD>

## 2012-06-12 NOTE — Telephone Encounter (Signed)
Received a note from [place] stating that she received the ct report and Dr. Nedra Hai will handle ordering the [place].

## 2012-06-12 NOTE — Telephone Encounter (Signed)
I spoke with [facility] and was told to fax the report to [phone], attn:  Dr. Nedra Hai and nurse Amy. This has been faxed and I noted on the cover sheet that I would like for Amy to call me to confirm she has received this.

## 2012-06-12 NOTE — Progress Notes (Signed)
Quick Note:  Called and spoke with patient patient is aware of results/recs as listed below per Dr. Shelle Iron. Patient verbalized understanding of this and nothing further needed. ______

## 2012-06-12 NOTE — Telephone Encounter (Signed)
Hi, I got your fax, thank you for the CT report. We will contact patient and order the [place] of her thyroid. Thank you for letting us know. If you need anything further, please let me know. Amy/ Dr Conley Rolls.

## 2012-06-12 NOTE — Telephone Encounter (Signed)
Patient is at Dr Jearld Fenton office now and he is going to examine her vocal cords. I advised patient of the CT report and need for the [place] and Biopsy. Orders put in for this. Thank you. We need to keep an eye out for the biopsy report, sometimes this takes a while, Kay Ricciuti

## 2012-06-12 NOTE — Telephone Encounter (Signed)
Called and spoke with patient, patient was made aware of results/recs as listed below per Dr. Shelle Iron.  Patient verbalized understanding of this and per pt has her ENT appt set up. Nothing further needed at this time.  Patient states [facility].  Last MD she saw there was Dr. Nedra Hai.  Will forward to Dr. Shelle Iron

## 2012-06-12 NOTE — Telephone Encounter (Signed)
Lawson Fiscal has paperwork, will forward to [place] to further handle.  Thanks!!

## 2012-06-27 ENCOUNTER — Ambulatory Visit (INDEPENDENT_AMBULATORY_CARE_PROVIDER_SITE_OTHER): Payer: Medicare Other | Admitting: Pulmonary Disease

## 2012-06-27 DIAGNOSIS — R062 Wheezing: Secondary | ICD-10-CM

## 2012-06-27 DIAGNOSIS — R0600 Dyspnea, unspecified: Secondary | ICD-10-CM

## 2012-06-27 DIAGNOSIS — R0989 Other specified symptoms and signs involving the circulatory and respiratory systems: Secondary | ICD-10-CM

## 2012-06-27 DIAGNOSIS — R0609 Other forms of dyspnea: Secondary | ICD-10-CM

## 2012-06-27 DIAGNOSIS — R918 Other nonspecific abnormal finding of lung field: Secondary | ICD-10-CM

## 2012-06-27 LAB — PULMONARY FUNCTION TEST

## 2012-06-27 NOTE — Progress Notes (Signed)
PFT done today. 

## 2012-07-03 ENCOUNTER — Ambulatory Visit
Admission: RE | Admit: 2012-07-03 | Discharge: 2012-07-03 | Disposition: A | Discharge status: Home / Self Care | Payer: Medicare Other | Source: Ambulatory Visit | Attending: Family Medicine | Admitting: Family Medicine

## 2012-07-03 ENCOUNTER — Other Ambulatory Visit (HOSPITAL_COMMUNITY)
Admission: RE | Admit: 2012-07-03 | Discharge: 2012-07-03 | Disposition: A | Discharge status: Home / Self Care | Payer: Medicare Other | Source: Ambulatory Visit | Attending: Interventional Radiology | Admitting: Interventional Radiology

## 2012-07-03 DIAGNOSIS — E049 Nontoxic goiter, unspecified: Secondary | ICD-10-CM | POA: Insufficient documentation

## 2012-07-03 DIAGNOSIS — E041 Nontoxic single thyroid nodule: Secondary | ICD-10-CM

## 2012-07-05 ENCOUNTER — Telehealth: Payer: Self-pay | Admitting: Pulmonary Disease

## 2012-07-05 NOTE — Telephone Encounter (Signed)
Pt needs an ov to review her breathing tests with her. Also need copy of note from recent ENT evaluation.

## 2012-07-09 ENCOUNTER — Telehealth: Payer: Self-pay | Admitting: Family Medicine

## 2012-07-09 ENCOUNTER — Encounter: Payer: Self-pay | Admitting: Pulmonary Disease

## 2012-07-09 NOTE — Telephone Encounter (Addendum)
Pt has been scheduled for Wed., 07/25/12 @ 9:15 am with KC to discuss PFT results. Awaiting last OV note from Dr. Jearld Fenton. OV note from Dr. Jearld Fenton received and placed in [facility] look at folder (green).

## 2012-07-09 NOTE — Telephone Encounter (Signed)
Spoke to patient about thyroid results.

## 2012-07-20 ENCOUNTER — Ambulatory Visit: Payer: Medicare Other | Admitting: Pulmonary Disease

## 2012-07-25 ENCOUNTER — Encounter: Payer: Self-pay | Admitting: Pulmonary Disease

## 2012-07-25 ENCOUNTER — Ambulatory Visit (INDEPENDENT_AMBULATORY_CARE_PROVIDER_SITE_OTHER): Payer: Medicare HMO | Admitting: Pulmonary Disease

## 2012-07-25 VITALS — BP 110/78 | HR 66 | Temp 98.4°F | Ht 62.5 in | Wt 168.8 lb

## 2012-07-25 DIAGNOSIS — R0609 Other forms of dyspnea: Secondary | ICD-10-CM

## 2012-07-25 DIAGNOSIS — R0600 Dyspnea, unspecified: Secondary | ICD-10-CM

## 2012-07-25 DIAGNOSIS — R062 Wheezing: Secondary | ICD-10-CM

## 2012-07-25 MED ORDER — OMEPRAZOLE 40 MG PO CPDR: 40.0000 mg | DELAYED_RELEASE_CAPSULE | Freq: Two times a day (BID) | ORAL | Status: DC

## 2012-07-25 NOTE — Progress Notes (Signed)
  Subjective:    Patient ID: Pamela Small, female    DOB: 01-30-1946, 67 y.o.   MRN: [id number]  HPI Patient comes in today for followup of her complaint of wheezing and dyspnea.  At the last visit, she was felt to primarily have upper airway wheezing rather than lower.  She was scheduled for an evaluation of her neck with CT scanning, and also scheduled for pulmonary function studies.  The CT scan showed sinusitis, as well as a questionable abnormality in the piriform sinus.  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  She was treated with clindamycin, as well as b.i.d. Proton pump inhibitor.  She has had some improvement in her symptoms since that time.  Her PFTs surprisingly showed mild airflow obstruction with some air trapping, very mild restriction, as well as a mild reduction in her diffusion capacity.  Therefore, this is most consistent with the diagnosis of asthma.  She currently is not on inhaled corticosteroids.   Review of Systems  Constitutional: Negative for fever and unexpected weight change.  HENT: Positive for congestion and postnasal drip. Negative for ear pain, nosebleeds, sore throat, rhinorrhea, sneezing, trouble swallowing, dental problem and sinus pressure.   Eyes: Negative for redness and itching.  Respiratory: Positive for cough and wheezing. Negative for chest tightness and shortness of breath.   Cardiovascular: Negative for palpitations and leg swelling.  Gastrointestinal: Negative for nausea and vomiting.  Genitourinary: Negative for dysuria.  Musculoskeletal: Negative for joint swelling.  Skin: Negative for rash.  Neurological: Negative for headaches.  Hematological: Does not bruise/bleed easily.  Psychiatric/Behavioral: Negative for dysphoric mood. The patient is not nervous/anxious.        Objective:   Physical Exam Overweight female  in no acute distress Nose without purulent discharge noted Oropharynx clear Chest with loud upper airway pseudo-wheezing, excellent air flow, no true wheezing noted Cardiac exam is regular rate and rhythm Lower extremities with minimal edema, cyanosis Alert and oriented, moves all 4 extremities.       Assessment & Plan:

## 2012-07-25 NOTE — Assessment & Plan Note (Signed)
The patient clearly has a significant upper airway component to her wheezing.  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  She has finished up her antibiotics, and I would like her to stay on b.i.d. Proton pump inhibitor to help decrease upper airway inflammation.

## 2012-07-25 NOTE — Patient Instructions (Addendum)
Start qvar  2 inhalations am and pm everyday whether you need it or not.  Rinse mouth out well, gargle, and spit.  Do not use albuterol unless you need for rescue.  If you have been on qvar for 2 weeks, and still need to use albuterol more than twice a week, let me know.  Would take omeprazole 40mg , one in am and pm until next visit. followup with me in 4 weeks.

## 2012-07-25 NOTE — Assessment & Plan Note (Signed)
The patient has mild airflow obstruction on her PFTs, and based on history this is most consistent with asthma.  We will need to start her on an inhaled corticosteroid, and try and decrease her albuterol use.  I've had a long discussion with her about asthma, including the need to stay on medicines daily whether she thinks she needs it or not.

## 2012-07-26 ENCOUNTER — Encounter: Payer: Self-pay | Admitting: Pulmonary Disease

## 2012-08-09 ENCOUNTER — Other Ambulatory Visit: Payer: Self-pay | Admitting: Pulmonary Disease

## 2012-08-09 DIAGNOSIS — R0600 Dyspnea, unspecified: Secondary | ICD-10-CM

## 2012-08-21 ENCOUNTER — Telehealth: Payer: Self-pay | Admitting: Pulmonary Disease

## 2012-08-21 MED ORDER — BECLOMETHASONE DIPROPIONATE 80 MCG/ACT IN AERS: 2.0000 | INHALATION_SPRAY | Freq: Two times a day (BID) | RESPIRATORY_TRACT | Status: DC

## 2012-08-21 MED ORDER — BECLOMETHASONE DIPROPIONATE 80 MCG/ACT IN AERS: 1.0000 | INHALATION_SPRAY | Freq: Two times a day (BID) | RESPIRATORY_TRACT | Status: DC

## 2012-08-21 NOTE — Telephone Encounter (Signed)
Patient requesting Rx for QVar 80 sent to verified pharmacy.  Rx sent nothing further needed at this time, will route to [facility] as FYI.  Per 07/25/12 Office Note: Patient Instructions     Start qvar 2 inhalations am and pm everyday whether you need it or not. Rinse mouth out well, gargle, and spit.  Do not use albuterol unless you need for rescue. If you have been on qvar for 2 weeks, and still need to use albuterol more than twice a week, let me know.  Would take omeprazole 40mg , one in am and pm until next visit.  followup with me in 4 weeks.

## 2012-08-22 ENCOUNTER — Ambulatory Visit: Payer: Medicare HMO | Admitting: Pulmonary Disease

## 2012-08-29 ENCOUNTER — Ambulatory Visit: Payer: Medicare HMO | Admitting: Pulmonary Disease

## 2012-09-26 ENCOUNTER — Emergency Department (HOSPITAL_COMMUNITY): Payer: Medicare HMO

## 2012-09-26 ENCOUNTER — Emergency Department (HOSPITAL_COMMUNITY)
Admission: EM | Admit: 2012-09-26 | Discharge: 2012-09-26 | Disposition: A | Discharge status: Home / Self Care | Payer: Medicare HMO | Attending: Emergency Medicine | Admitting: Emergency Medicine

## 2012-09-26 ENCOUNTER — Encounter (HOSPITAL_COMMUNITY): Payer: Self-pay

## 2012-09-26 DIAGNOSIS — W010XXA Fall on same level from slipping, tripping and stumbling without subsequent striking against object, initial encounter: Secondary | ICD-10-CM | POA: Insufficient documentation

## 2012-09-26 DIAGNOSIS — Y9389 Activity, other specified: Secondary | ICD-10-CM | POA: Insufficient documentation

## 2012-09-26 DIAGNOSIS — Z79899 Other long term (current) drug therapy: Secondary | ICD-10-CM | POA: Insufficient documentation

## 2012-09-26 DIAGNOSIS — W1809XA Striking against other object with subsequent fall, initial encounter: Secondary | ICD-10-CM | POA: Insufficient documentation

## 2012-09-26 DIAGNOSIS — M79604 Pain in right leg: Secondary | ICD-10-CM

## 2012-09-26 DIAGNOSIS — Y9229 Other specified public building as the place of occurrence of the external cause: Secondary | ICD-10-CM | POA: Insufficient documentation

## 2012-09-26 DIAGNOSIS — IMO0002 Reserved for concepts with insufficient information to code with codable children: Secondary | ICD-10-CM | POA: Insufficient documentation

## 2012-09-26 DIAGNOSIS — E785 Hyperlipidemia, unspecified: Secondary | ICD-10-CM | POA: Insufficient documentation

## 2012-09-26 DIAGNOSIS — J45909 Unspecified asthma, uncomplicated: Secondary | ICD-10-CM | POA: Insufficient documentation

## 2012-09-26 DIAGNOSIS — W19XXXA Unspecified fall, initial encounter: Secondary | ICD-10-CM

## 2012-09-26 DIAGNOSIS — S8990XA Unspecified injury of unspecified lower leg, initial encounter: Secondary | ICD-10-CM | POA: Insufficient documentation

## 2012-09-26 MED ORDER — HYDROCODONE-ACETAMINOPHEN 5-325 MG PO TABS: 1.0000 | ORAL_TABLET | Freq: Four times a day (QID) | ORAL | Status: DC | PRN

## 2012-09-26 MED ORDER — HYDROCODONE-ACETAMINOPHEN 7.5-325 MG/15ML PO SOLN
10.0000 mL | Freq: Once | ORAL | Status: AC
  Administered 2012-09-26: 10 mL via ORAL
  Filled 2012-09-26: qty 15

## 2012-09-26 NOTE — ED Notes (Signed)
Pt states at [employer] slipped on soda on the floor and landed on lt knee, hit the cart with rt leg, pain and bruising to rt leg, states walking ok

## 2012-09-26 NOTE — ED Provider Notes (Signed)
History     CSN: [id number]  Arrival date & time 09/26/12  1019   First MD Initiated Contact with Patient 09/26/12 1021      Chief Complaint  Patient presents with  . Leg Pain  . Fall    (Consider location/radiation/quality/duration/timing/severity/associated sxs/prior treatment) HPI Comments: HARRIETT AZAR is a 67 y.o. female with a history of hyperlipidemia and asthma presents emergency department status post mechanical fall complaining of right lower extremity pain.  Onset of symptoms began just prior to arrival.  Pain is located in the anterior tib-fib area.  Pain severity rated at 6/10 without radiation and described as sharp throbbing.  Symptoms are moderate.  Exacerbating factors include use of limb and palpation.  Patient is able to bear weight.  No known alleviating factors have been attempted. Situation occurred at [facility] and patient describes slipping on soda while holding her grocery cart with point of impact being left knee and right leg sliding under cart with her shin hitting the lower grocery cart metal bar.  Height of fall was approximately 2 feet.  Patient denies hitting her head, any loss of consciousness, blood loss, syncope, disequilibrium or ataxia.  No other complaint at this time.  Patient is a 67 y.o. female presenting with leg pain and fall. The history is provided by the patient.  Leg Pain Associated symptoms: no fever and no neck pain   Fall Pertinent negatives include no fever and no headaches.    Past Medical History  Diagnosis Date  . Hyperlipidemia   . Asthma     Past Surgical History  Procedure Laterality Date  . Abdominal hysterectomy    . Colonoscopy    . Polypectomy    . Foot arthrodesis, modified mcbride      Family History  Problem Relation Age of Onset  . Breast cancer Daughter   . Hyperlipidemia Mother   . Diabetes Maternal Grandmother   . Asthma Mother     History  Substance Use Topics  . Smoking status: Never Smoker   .  Smokeless tobacco: Never Used  . Alcohol Use: No    OB History   Grav Para Term Preterm Abortions TAB SAB Ect Mult Living                  Review of Systems  Constitutional: Negative for fever, diaphoresis and activity change.  HENT: Negative for congestion and neck pain.   Respiratory: Negative for cough.   Genitourinary: Negative for dysuria.  Musculoskeletal: Negative for myalgias.  Skin: Negative for color change and wound.  Neurological: Negative for headaches.  All other systems reviewed and are negative.    Allergies  Review of patient's allergies indicates no known allergies.  Home Medications   Current Outpatient Rx  Name  Route  Sig  Dispense  Refill  . albuterol (PROVENTIL HFA;VENTOLIN HFA) 108 (90 BASE) MCG/ACT inhaler   Inhalation   Inhale 2 puffs into the lungs every 6 (six) hours as needed for wheezing.   1 Inhaler   5   . amLODipine (NORVASC) 5 MG tablet   Oral   Take 1 tablet (5 mg total) by mouth daily.   180 tablet   3   . beclomethasone (QVAR) 80 MCG/ACT inhaler   Inhalation   Inhale 2 puffs into the lungs 2 (two) times daily.   1 Inhaler   6   . omeprazole (PRILOSEC) 40 MG capsule   Oral   Take 1 capsule (40 mg total) by  mouth 2 (two) times daily.   60 capsule   6   . rosuvastatin (CRESTOR) 40 MG tablet   Oral   Take 1 tablet (40 mg total) by mouth daily. I changed your dosage because your cholesterol was still too high.   30 tablet   5     BP 156/89  Pulse 82  Temp(Src) 98.7 F (37.1 C) (Oral)  Resp 20  SpO2 95%  Physical Exam  Nursing note and vitals reviewed. Constitutional: She appears well-developed and well-nourished. No distress.  HENT:  Head: Normocephalic and atraumatic.  Eyes: Conjunctivae and EOM are normal.  Neck: Normal range of motion. Neck supple.  Cardiovascular:  Intact distal pulses, capillary refill < 3 seconds  Musculoskeletal:  Left knee without any bony ttp and full normal ROM. Right shin ttp with  mild anterior swelling. All other extremities with normal ROM  Neurological:  No sensory deficit  Skin: She is not diaphoretic.  Skin intact, no tenting.    ED Course  Procedures (including critical care time)  Labs Reviewed - No data to display Dg Tibia/fibula Right  09/26/2012  *RADIOLOGY REPORT*  Clinical Data: Fall, pain.  RIGHT TIBIA AND FIBULA - 2 VIEW  Comparison: None.  Findings: No acute bony or joint abnormality is identified.  There is some mild appearing degenerative change about the knee.  On the lateral view, radiopaque density projecting above the superior pole of the patella is seen and may be external to the patient.  IMPRESSION: No acute finding.   Original Report Authenticated By: Holley Dexter, M.D.      No diagnosis found.    MDM  67 year old female to emergency department for evaluation of right lower extremity status post mechanical fall.  Imaging of right tib-fib ordered as patient was tender to palpation with swelling and bruising to this area.  She was neurovascularly intact and there is low concern for orthopedic emergency.  Pain managed in the emergency department and ice applied.  Disposition pending results of imaging.  Patient X-Ray negative for obvious fracture or dislocation. Pt advised to follow up with orthopedics if symptoms persist for possibility of missed fracture diagnosis. Conservative therapy recommended and discussed. Patient will be dc home & is agreeable with above plan.         Jaci Carrel, [place] 09/26/12 1210

## 2012-09-27 NOTE — ED Provider Notes (Signed)
Medical screening examination/treatment/procedure(s) were performed by non-physician practitioner and as supervising physician I was immediately available for consultation/collaboration.  Tobin Chad, MD 09/27/12 1009

## 2012-10-08 ENCOUNTER — Other Ambulatory Visit: Payer: Self-pay | Admitting: Nurse Practitioner

## 2012-10-08 ENCOUNTER — Ambulatory Visit
Admission: RE | Admit: 2012-10-08 | Discharge: 2012-10-08 | Disposition: A | Discharge status: Home / Self Care | Payer: Medicare HMO | Source: Ambulatory Visit | Attending: Nurse Practitioner | Admitting: Nurse Practitioner

## 2012-10-08 DIAGNOSIS — R062 Wheezing: Secondary | ICD-10-CM

## 2012-10-08 DIAGNOSIS — R0602 Shortness of breath: Secondary | ICD-10-CM

## 2013-09-17 ENCOUNTER — Other Ambulatory Visit: Payer: Self-pay | Admitting: Internal Medicine

## 2013-09-17 ENCOUNTER — Ambulatory Visit
Admission: RE | Admit: 2013-09-17 | Discharge: 2013-09-17 | Disposition: A | Discharge status: Home / Self Care | Payer: Commercial Managed Care - HMO | Source: Ambulatory Visit | Attending: Internal Medicine | Admitting: Internal Medicine

## 2013-09-17 DIAGNOSIS — M25519 Pain in unspecified shoulder: Secondary | ICD-10-CM

## 2013-10-03 ENCOUNTER — Encounter: Payer: Self-pay | Admitting: Family Medicine

## 2013-10-03 DIAGNOSIS — E041 Nontoxic single thyroid nodule: Secondary | ICD-10-CM

## 2013-10-28 IMAGING — US US THYROID BIOPSY
1 series · 14 of 18 positions shown · non-contrast
Comparison: 06/08/2012, 07/03/2012

CLINICAL DATA: Indeterminate thyroid nodule by CT and ultrasound

ULTRASOUND GUIDED NEEDLE ASPIRATE BIOPSY OF THE THYROID GLAND

[Series 1: us thyroid biopsy · 0.07mm/px · 18 acquisitions, 14 frames shown]
[im 1/18]
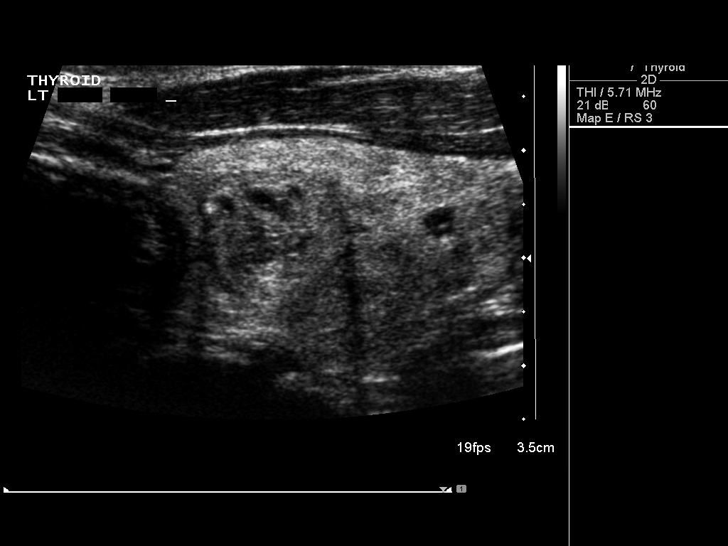
[im 2/18]
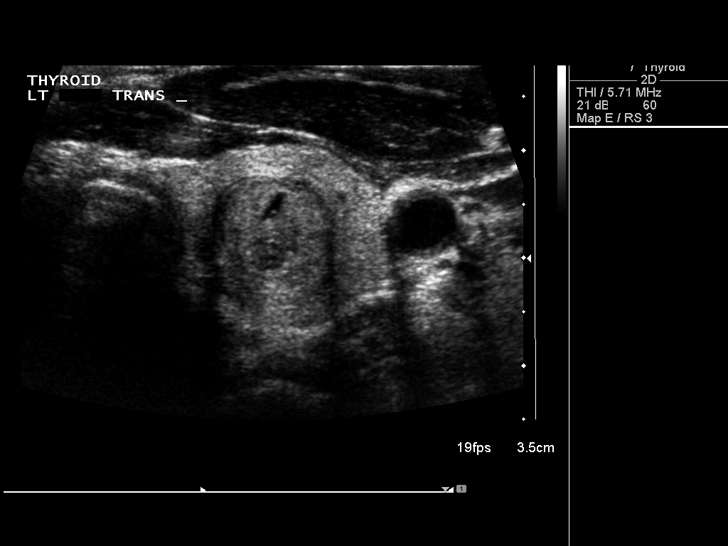
[im 4/18]
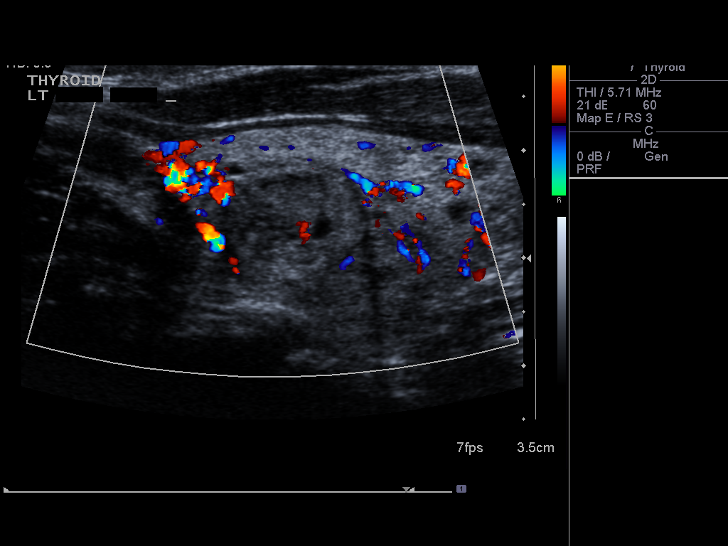
[im 5/18]
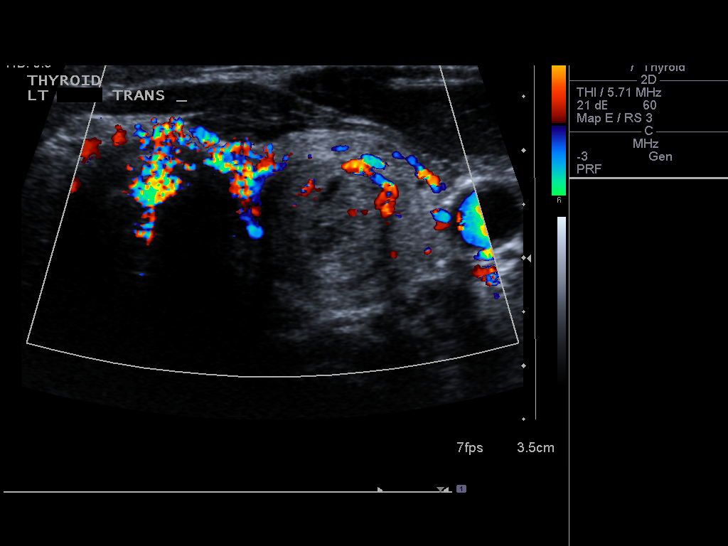
[im 6/18]
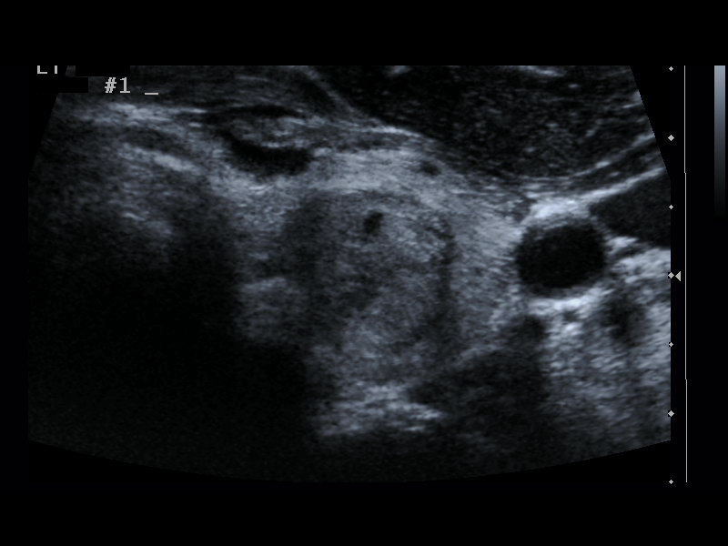
[im 8/18]
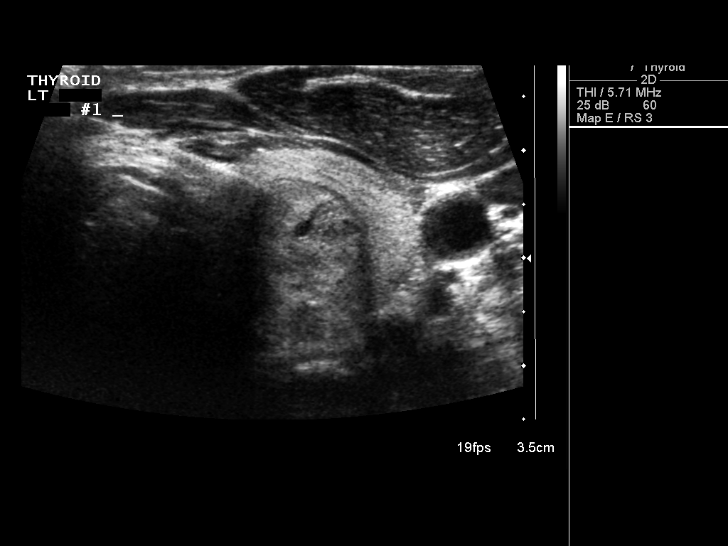
[im 9/18]
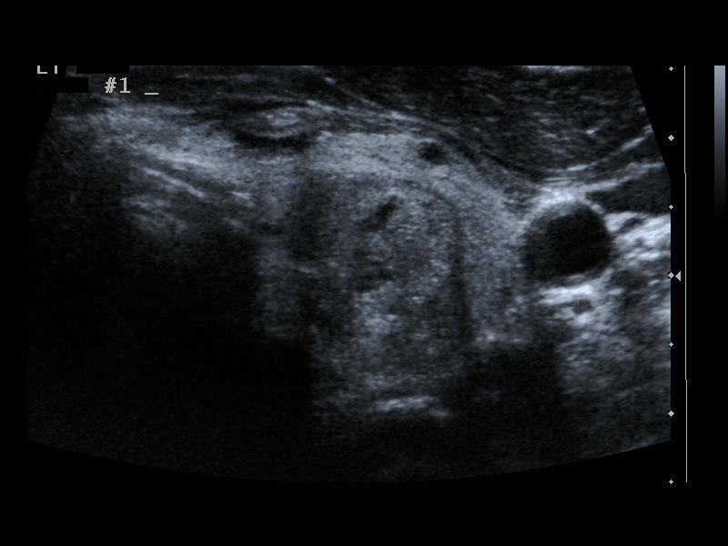
[im 10/18]
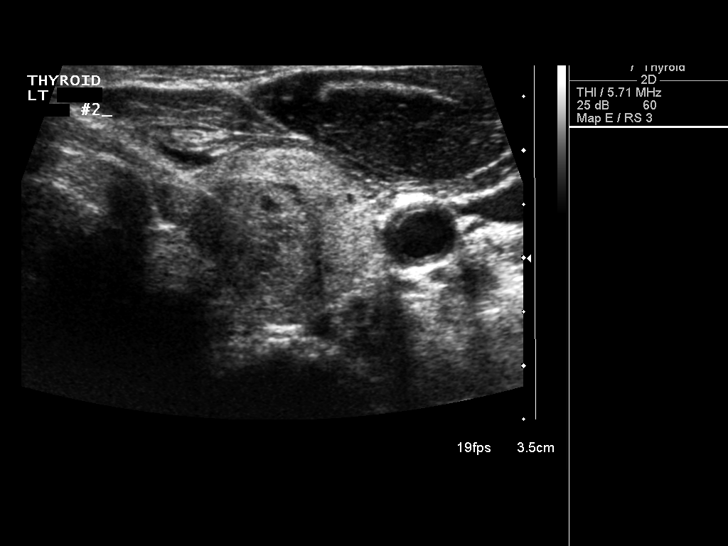
[im 11/18]
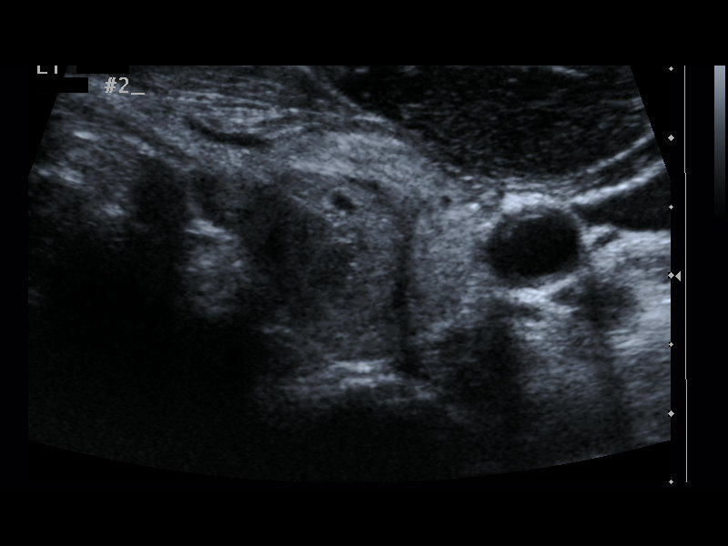
[im 13/18]
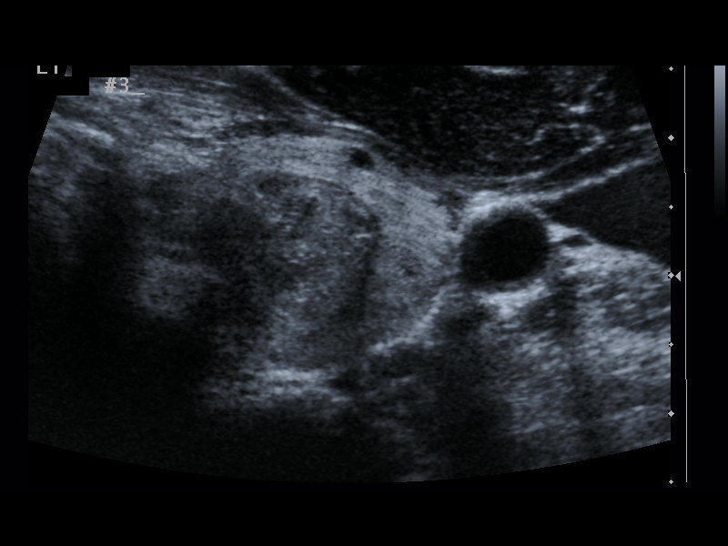
[im 14/18]
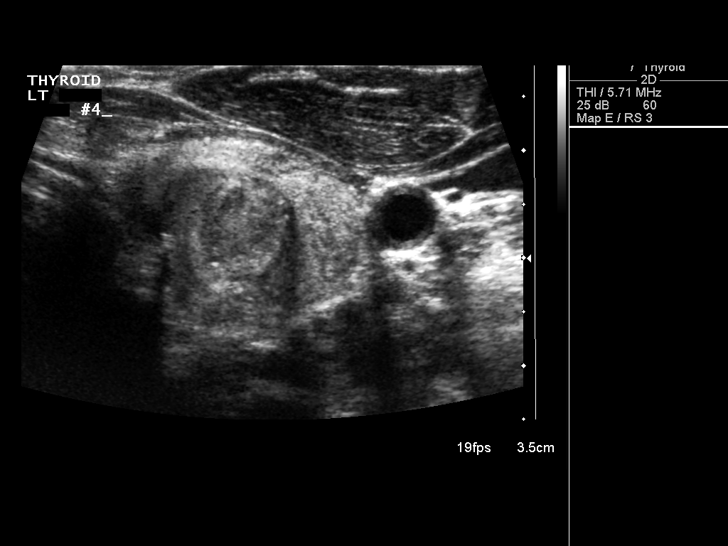
[im 15/18]
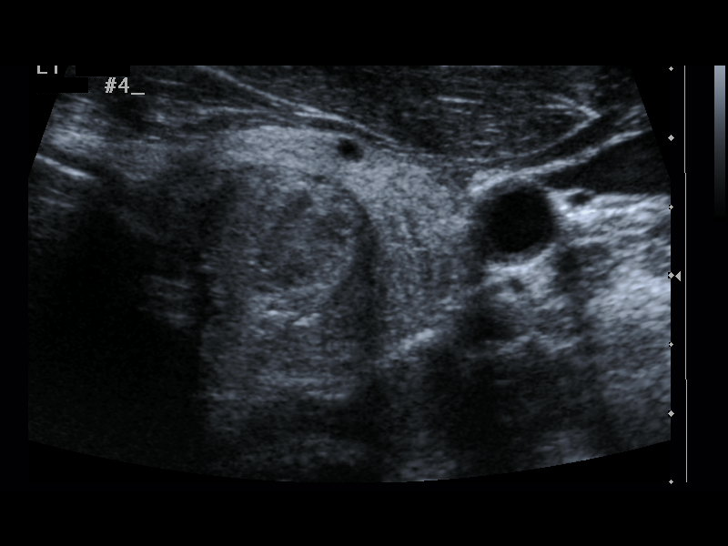
[im 17/18]
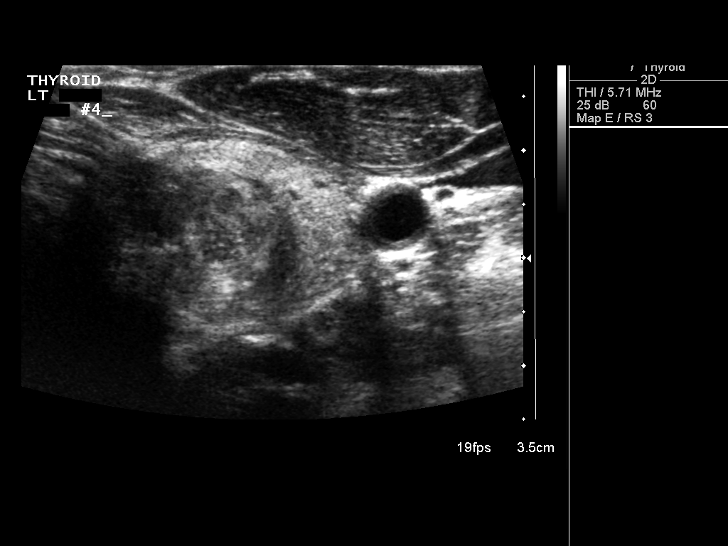
[im 18/18]
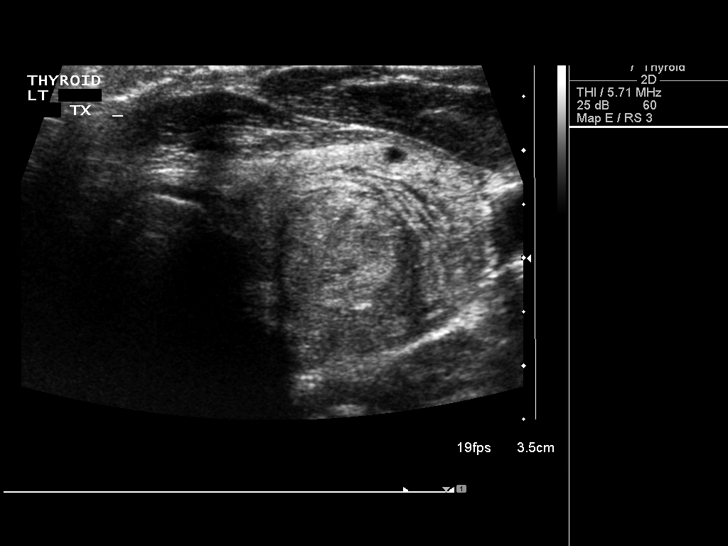

[14 of 18 positions shown; findings below may reference images not displayed]

Thyroid biopsy was thoroughly discussed with the patient and
questions were answered.  The benefits, risks, alternatives, and
complications were also discussed.  The patient understands and
wishes to proceed with the procedure.  Written consent was
obtained.

Ultrasound was performed to localize and mark an adequate site for
the biopsy.  The patient was then prepped and draped in a normal
sterile fashion.  Local anesthesia was provided with 1% lidocaine.
Using direct ultrasound guidance, 4 passes were made using 25 gauge
needles into the nodule within the left lobe of the thyroid.
Ultrasound was used to confirm needle placements on all occasions.
Specimens were sent to Pathology for analysis.

Complications:  No immediate
FINDINGS: Imaging confirms needle placement in the dominant left
thyroid nodule
IMPRESSION: Ultrasound guided needle aspirate biopsy performed of the dominant
left thyroid nodule.

## 2013-10-28 IMAGING — US US SOFT TISSUE HEAD/NECK
1 series · 14 of 25 positions shown · non-contrast
Comparison: 06/08/2012

CLINICAL DATA: Dominant left indeterminate thyroid nodule by neck
CT

THYROID ULTRASOUND
TECHNIQUE: Ultrasound examination of the thyroid gland and adjacent
soft tissues was performed.

[Series 1: us soft tissue head/neck · 0.08mm/px · 14 of 59 slices shown]
[im 1/59]
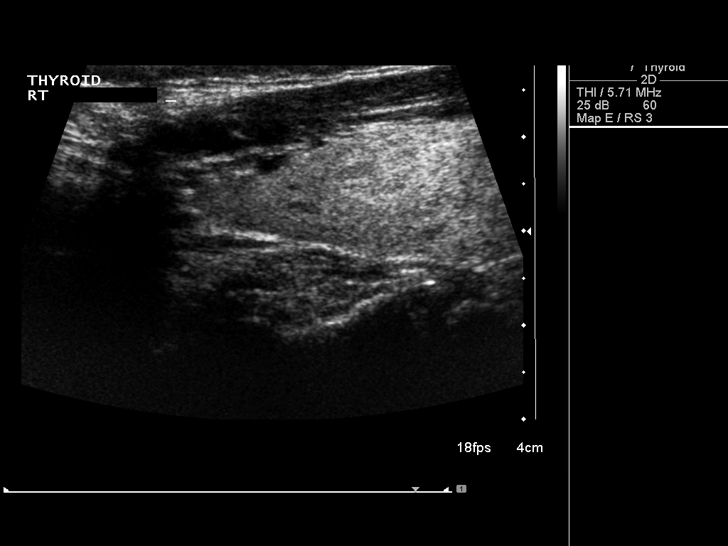
[im 5/59]
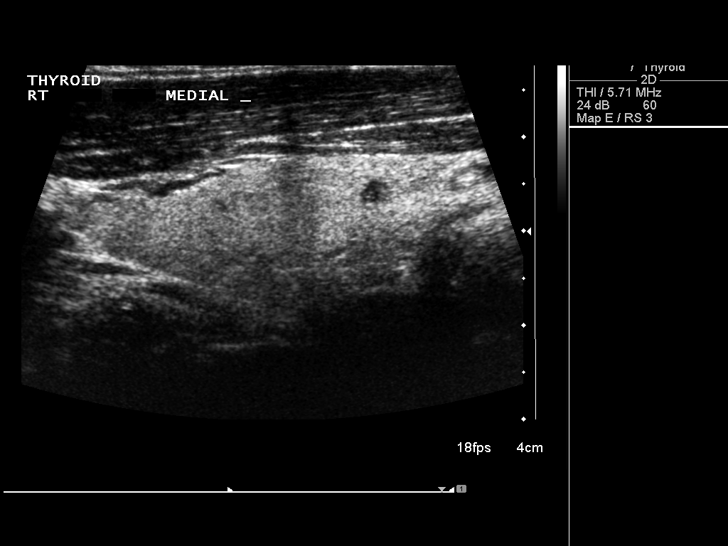
[im 10/59]
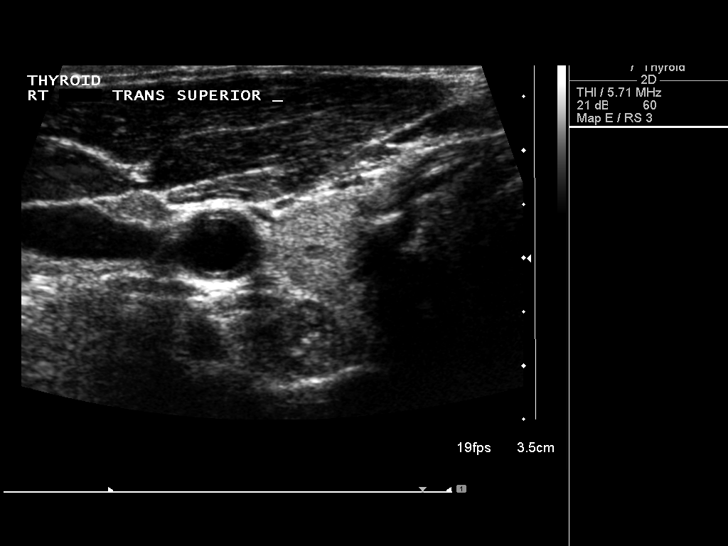
[im 15/59]
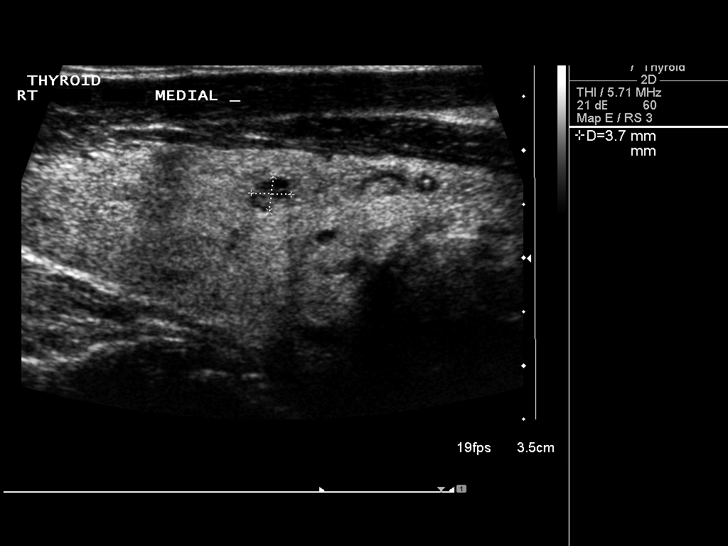
[im 20/59]
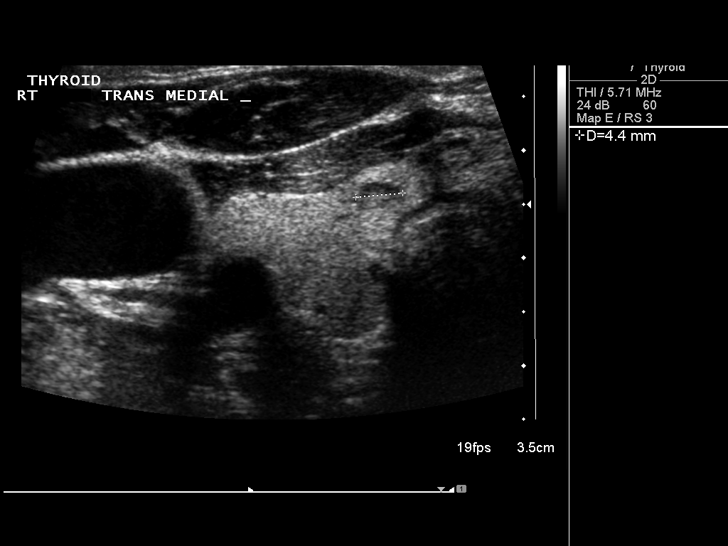
[im 22/59]
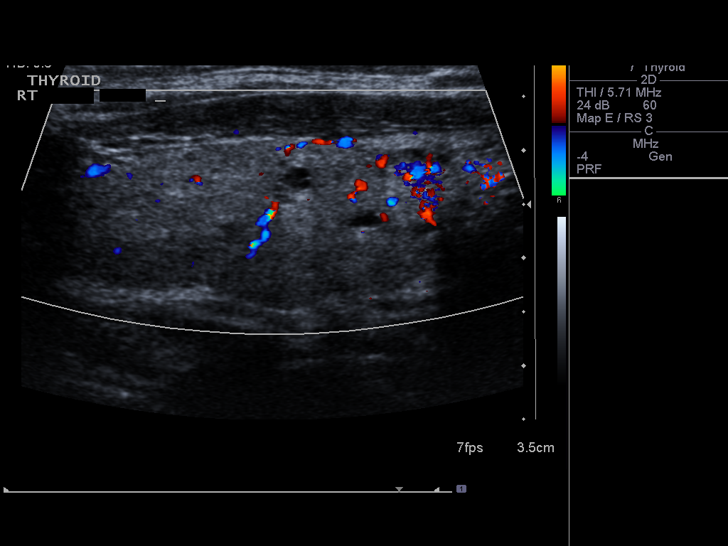
[im 27/59]
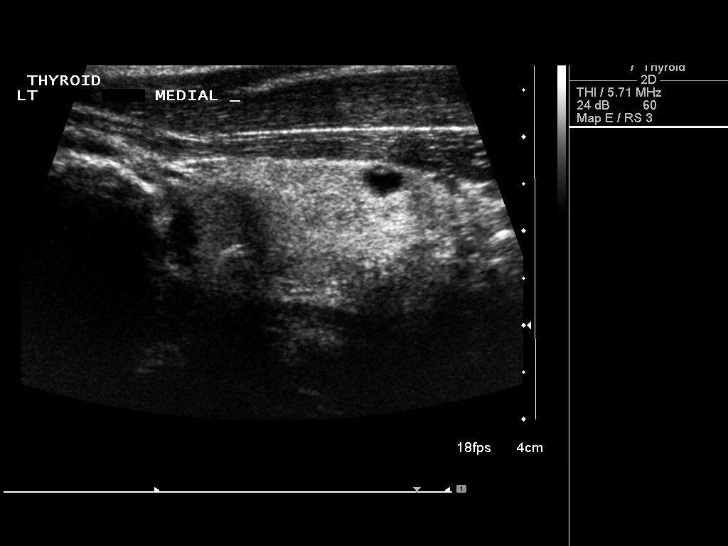
[im 32/59]
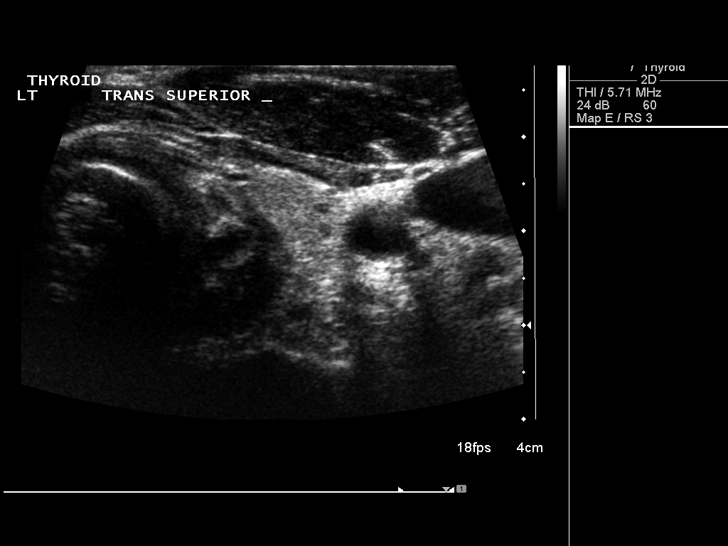
[im 37/59]
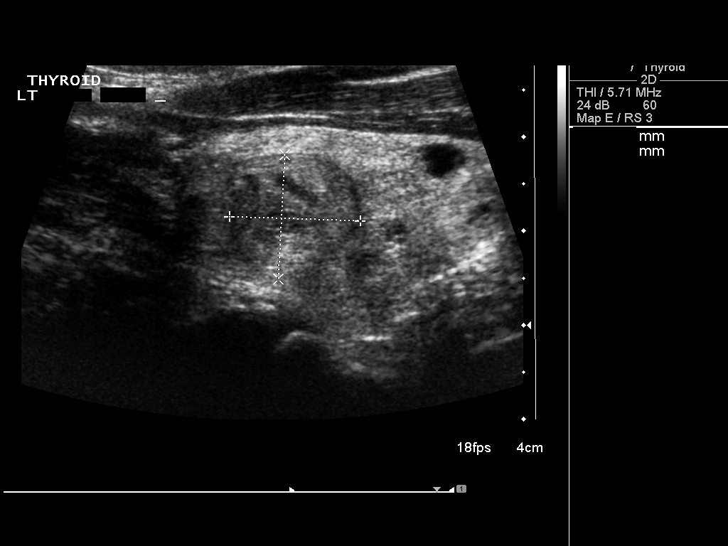
[im 39/59]
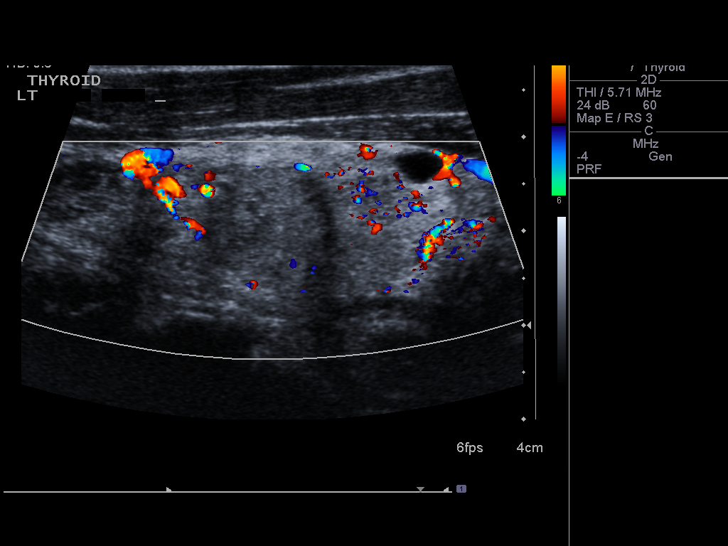
[im 44/59]
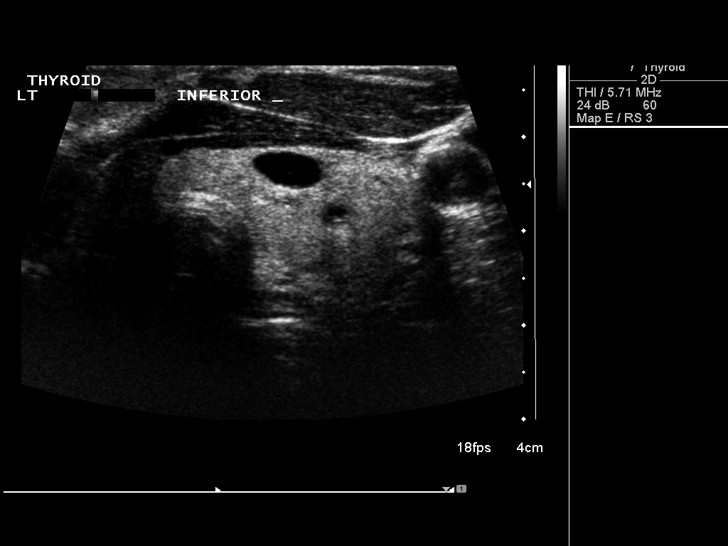
[im 49/59]
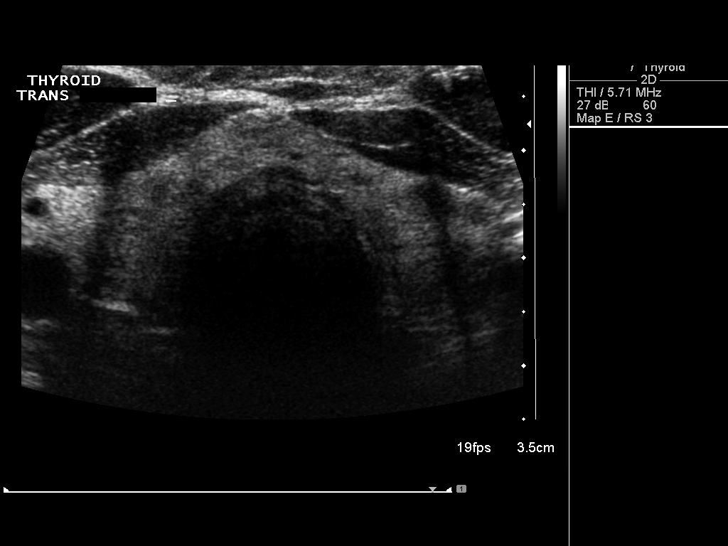
[im 54/59]
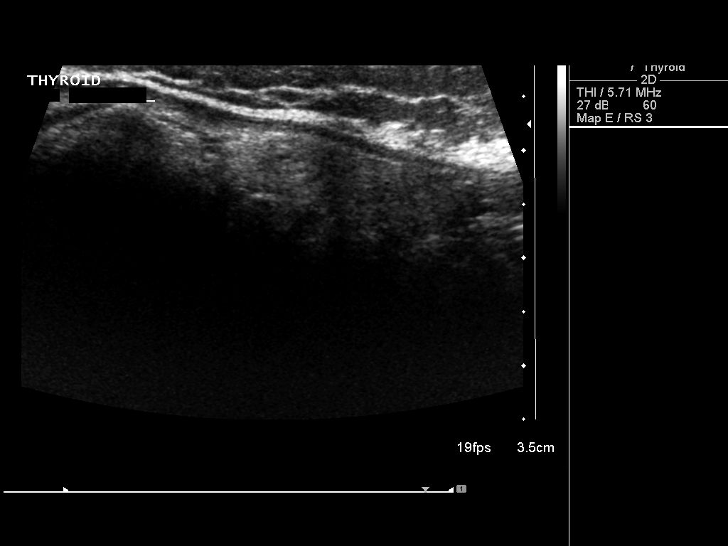
[im 59/59]
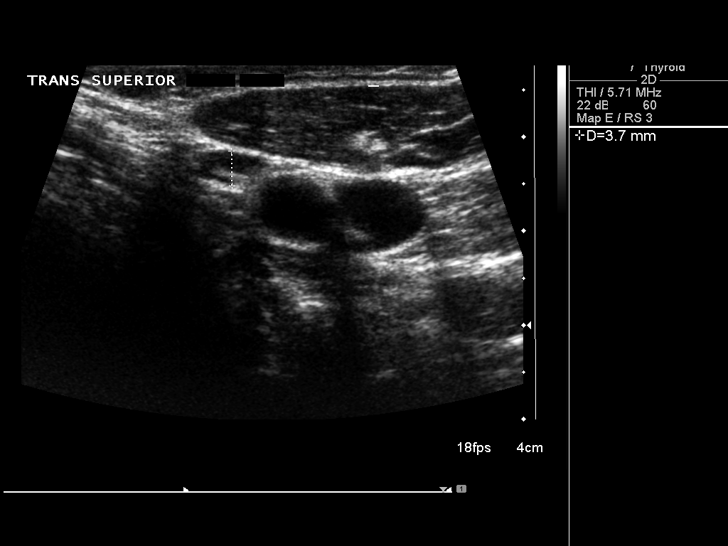

[14 of 25 positions shown; findings below may reference images not displayed]

FINDINGS: Right thyroid lobe:  Measures 5.0 x 1.6 x 2.1 cm.
Left thyroid lobe:  Measures 4.5 x 2.2 x 2.1 cm.
Isthmus:  4.8 mm thickness

Focal nodules:

Right thyroid lobe:  Mildly heterogeneous echotexture.  Scattered
tiny sub centimeter hypoechoic nodules and/or cysts noted
throughout the right thyroid lobe.  No suspicious right thyroid
lesion demonstrated.

Left thyroid lobe:  Dominant solid heterogeneous nodule in the left
thyroid lobe centrally measures 1.9 x 1.4 x 1.2 cm.  This
correlates with CT finding and is the dominant left thyroid lesion.
Scattered left thyroid sub centimeter nodules and cysts noted.

Lymphadenopathy:  None visualized.
IMPRESSION: Dominant solid 1.9 cm left thyroid nodule correlates with the CT
finding.  This meets criteria for ultrasound FNA biopsy.

This recommendation follows the consensus statement:  Management of
Thyroid Nodules Detected as US:  Society of Radiologists in
800.

## 2014-06-09 IMAGING — CR DG CHEST 2V
2 series · 2 of 2 positions shown · non-contrast
Comparison: None.

CLINICAL DATA: Chest pain.

CHEST - 2 VIEW

[PA]
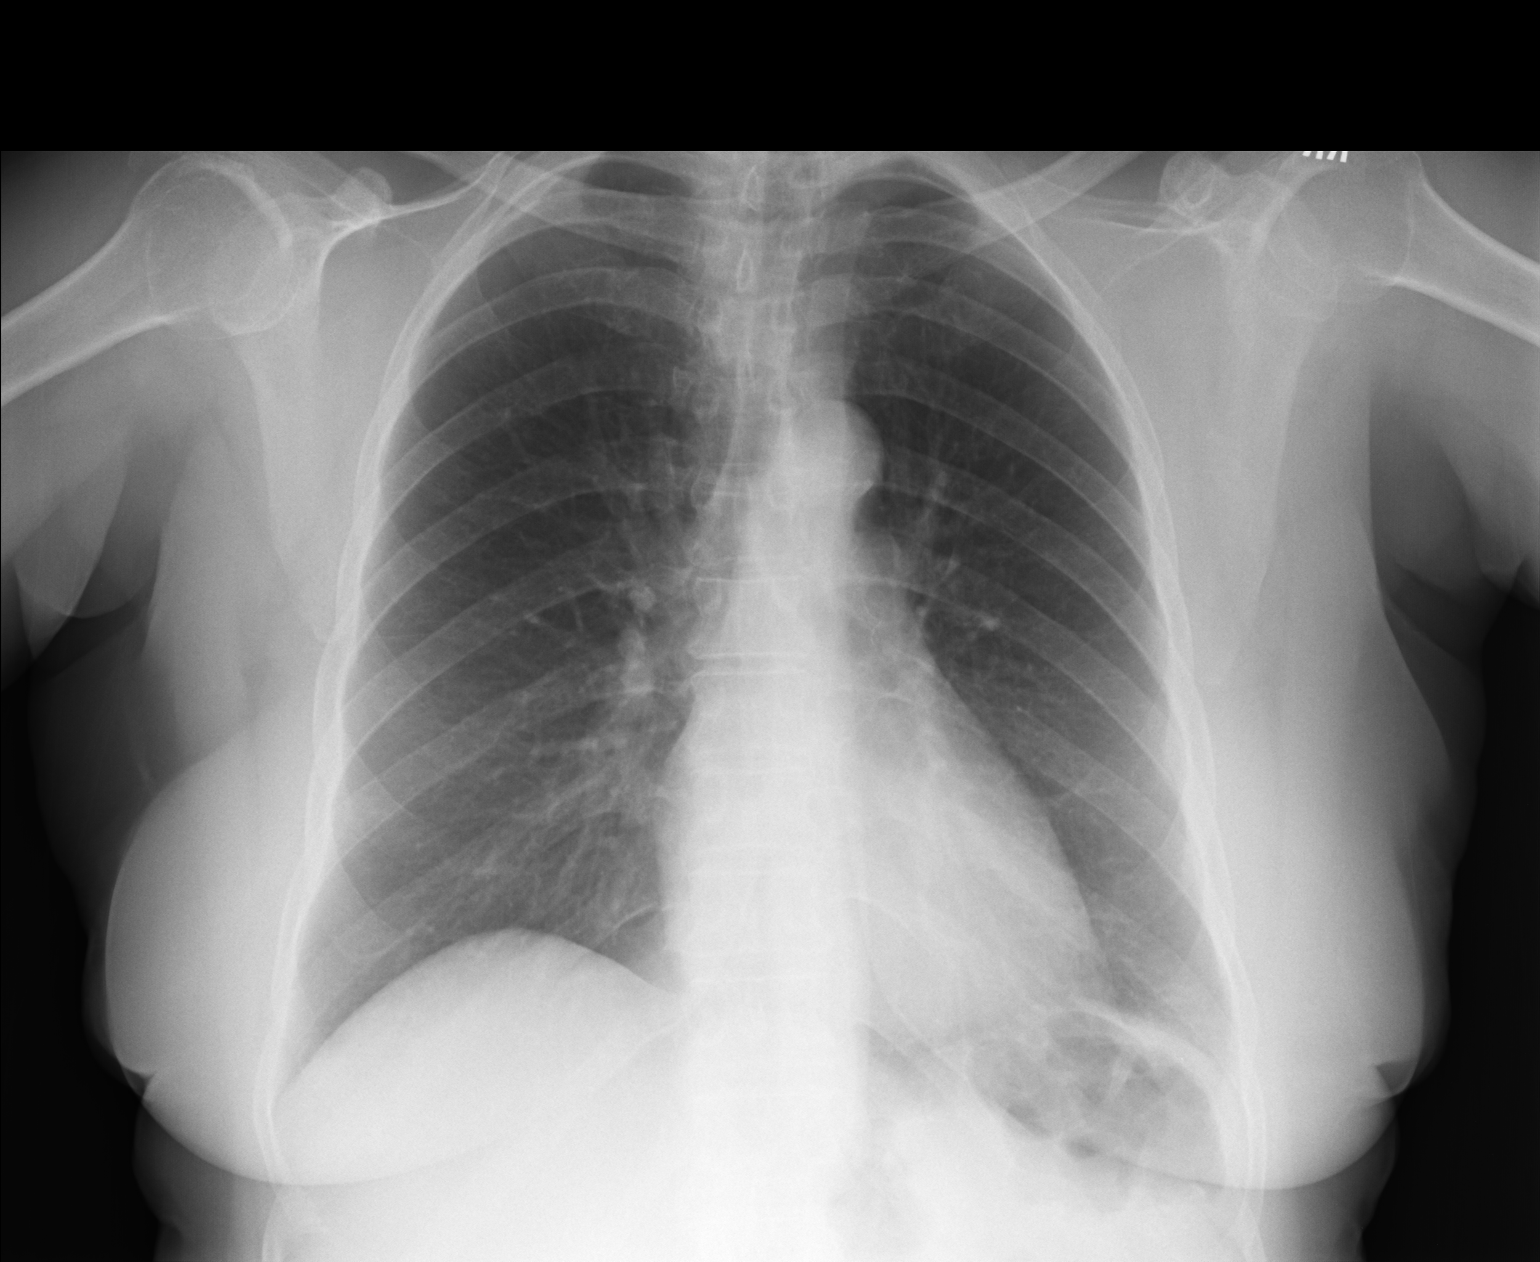

[lateral]
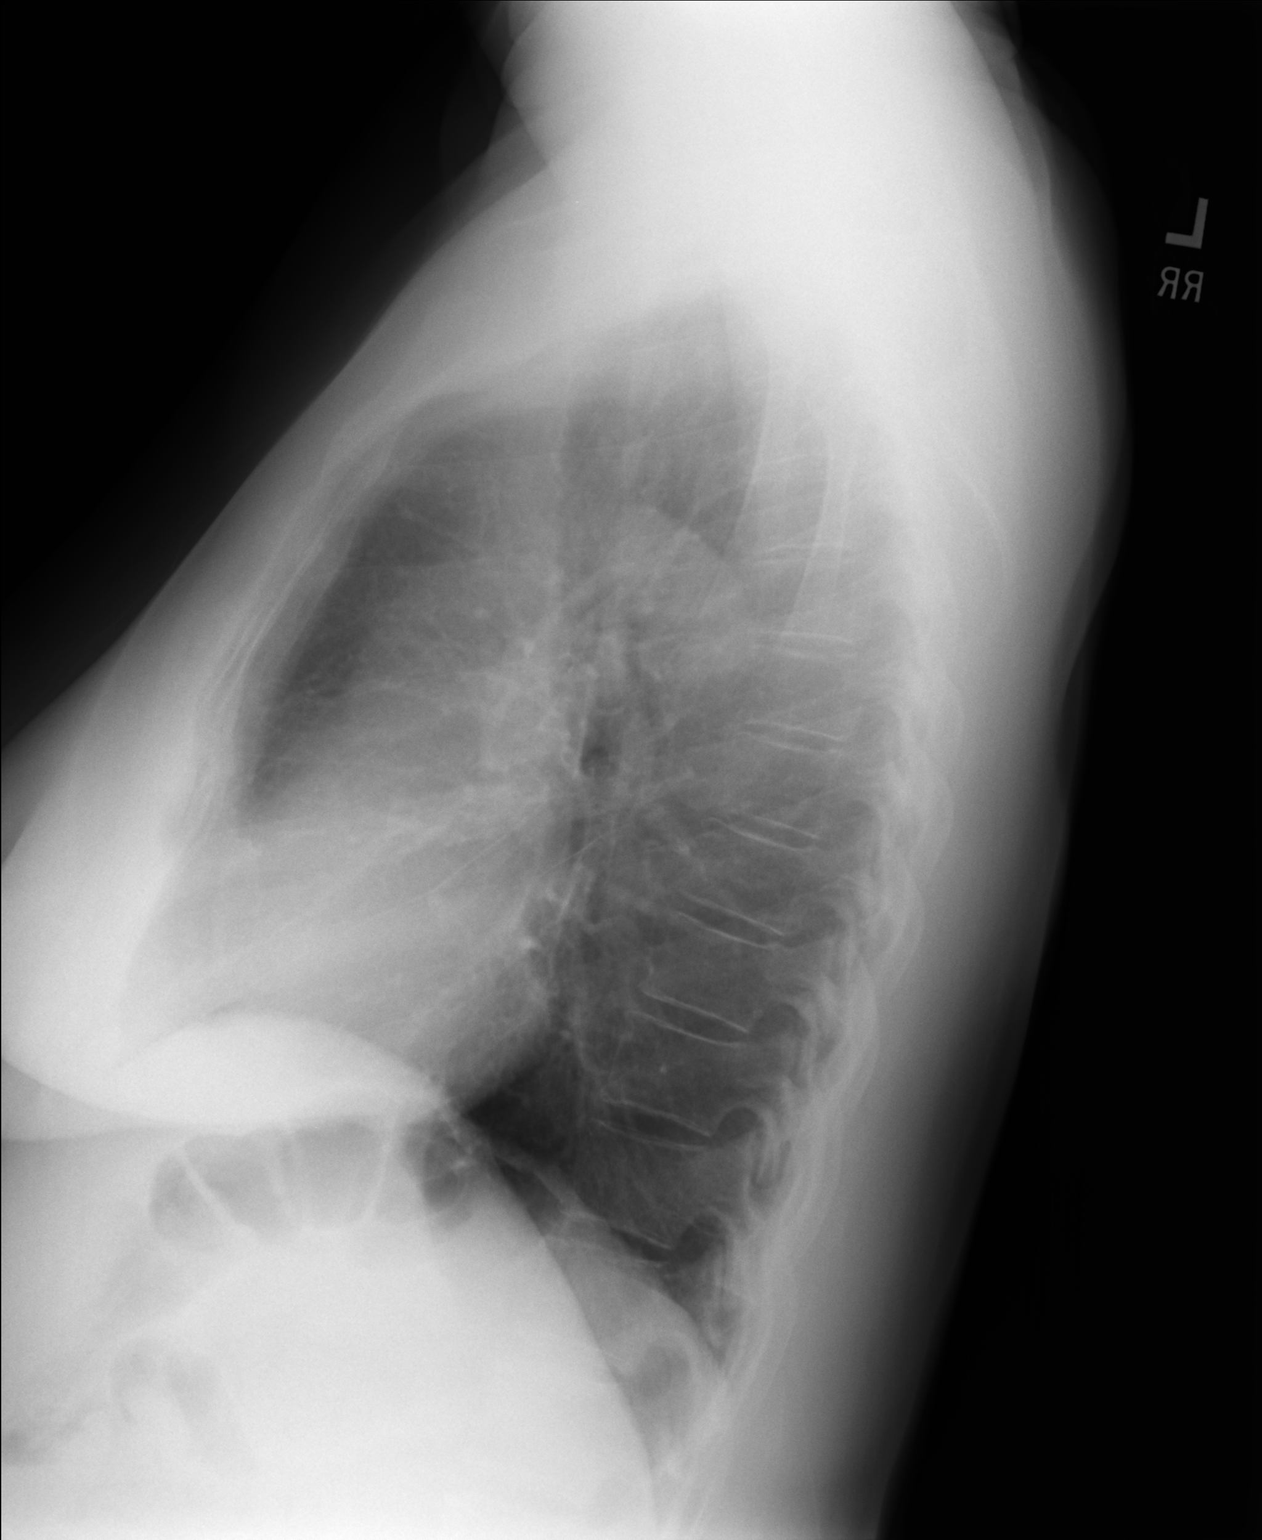

[2 of 2 positions shown; findings below may reference images not displayed]

FINDINGS: The heart, mediastinal and hilar contours are normal.
The lungs are clear and fully expanded.  There are no effusions or
pneumothoraces.  There are no bony abnormalities.
IMPRESSION: Normal chest.

## 2014-07-21 DIAGNOSIS — E782 Mixed hyperlipidemia: Secondary | ICD-10-CM | POA: Diagnosis not present

## 2014-07-21 DIAGNOSIS — Z634 Disappearance and death of family member: Secondary | ICD-10-CM | POA: Diagnosis not present

## 2014-07-21 DIAGNOSIS — R945 Abnormal results of liver function studies: Secondary | ICD-10-CM | POA: Diagnosis not present

## 2014-07-21 DIAGNOSIS — R7309 Other abnormal glucose: Secondary | ICD-10-CM | POA: Diagnosis not present

## 2014-07-21 DIAGNOSIS — Z0000 Encounter for general adult medical examination without abnormal findings: Secondary | ICD-10-CM | POA: Diagnosis not present

## 2014-09-08 NOTE — Progress Notes (Signed)
Patient ID: Pamela Small, female   DOB: 1946-02-11, 69 y.o.   MRN: [id number]     Cardiology Office Note   Date:  09/09/2014   ID:  Pamela Small, Pamela Small 29-Jul-1945, MRN [id number]  PCP:  Kennon Portela, MD  Cardiologist:   Jenkins Rouge, MD   No chief complaint on file.     History of Present Illness: Pamela Small is a 69 y.o. female who presents for evaluation of chest pain and HTN.  Last seen here over 3 years ago.  History of HTN, elevated lipids, asthma and lung nodules  Had cardiac CT 10/13 In [facility]  No osbstructive disease  IMPRESSION:  1. Mild nonobstructive coronary artery disease, as detailed above. The patient's total coronary artery calcium score is 58 which is the 79th percentile for patient's of matched age, sex and race/ethnicity. 2. Multiple tiny pulmonary nodules scattered throughout the periphery of the lungs bilaterally are favored to reflect areas of mucoid impaction within terminal bronchioles. The largest of these measures up to 3 mm. If the patient is at high risk for bronchogenic carcinoma, follow-up chest CT at 1 year is recommended. If the patient is at low risk, no follow-up is needed. This recommendation follows the consensus statement: Guidelines for Management of Small Pulmonary Nodules Detected on CT Scans: A Statement from the [place] as published in Radiology 2005; 237:395-400. 3. Codominance of the coronary arteries.  She has not seen a primary care doctor  In a long time.  Her husband died in June 16, 2023 and she felt they had neglected their health And it was too late by the time they went to hospital  She has had very high cholesterol in past but has not been on crestor in  Months    Past Medical History  Diagnosis Date  . Hyperlipidemia   . Asthma     Past Surgical History  Procedure Laterality Date  . Abdominal hysterectomy    . Colonoscopy    . Polypectomy    . Foot arthrodesis, modified mcbride        Current Outpatient Prescriptions  Medication Sig Dispense Refill  . rosuvastatin (CRESTOR) 40 MG tablet Take 1 tablet (40 mg total) by mouth daily. I changed your dosage because your cholesterol was still too high. 30 tablet 5   No current facility-administered medications for this visit.    Allergies:   Review of patient's allergies indicates no known allergies.    Social History:  The patient  reports that she has never smoked. She has never used smokeless tobacco. She reports that she does not drink alcohol or use illicit drugs.   Family History:  The patient's family history includes Asthma in her mother; Breast cancer in her daughter; Diabetes in her maternal grandmother; Hyperlipidemia in her mother.    ROS:  Please see the history of present illness.   Otherwise, review of systems are positive for none.   All other systems are reviewed and negative.    PHYSICAL EXAM: VS:  BP 140/90 mmHg  Pulse 68  Ht 5' 2.5" (1.588 m)  Wt 76.114 kg (167 lb 12.8 oz)  BMI 30.18 kg/m2 , BMI Body mass index is 30.18 kg/(m^2). GEN: Well nourished, well developed, in no acute distress HEENT: normal Neck: no JVD, carotid bruits, or masses Cardiac:  RRR; no murmurs, rubs, or gallops,no edema  Respiratory:  clear to auscultation bilaterally, normal work of breathing GI: soft, nontender, nondistended, + BS MS: no deformity or  atrophy Skin: warm and dry, no rash Neuro:  Strength and sensation are intact Psych: euthymic mood, full affect   EKG:   05/13/12  SR rate 74 normal  09/09/14  SR rate 68  nonspecfic lateral ST changes    Recent Labs: No results found for requested labs within last 365 days.    Lipid Panel    Component Value Date/Time   CHOL 347* 05/10/2012 0955   TRIG 123 05/10/2012 0955   HDL 46 05/10/2012 0955   CHOLHDL 7.5 05/10/2012 0955   VLDL 25 05/10/2012 0955   LDLCALC 276* 05/10/2012 0955      Wt Readings from Last 3 Encounters:  09/09/14 76.114 kg (167 lb  12.8 oz)  07/25/12 76.567 kg (168 lb 12.8 oz)  06/06/12 74.753 kg (164 lb 12.8 oz)      Other studies Reviewed: Additional studies/ records that were reviewed today include: Epic Records.    ASSESSMENT AND PLAN:  1.  Cholesterol :  Lab work today  Suspect she may need more than one drug.  $3 coupon for 3 month supply given  Encouraged her to see Dr Elder Cyphers  At [facility] as primary.  Will also check CMP, TSH, A1c since she has not had any labs in years  Current medicines are reviewed at length with the patient today.  The patient does not have concerns regarding medicines.  The following changes have been made:  crestor 20 mg   Labs/ tests ordered today include:  Labs  No orders of the defined types were placed in this encounter.     Disposition:   FU with next available  i    Signed, Jenkins Rouge, MD  09/09/2014 3:40 PM    [facility] [address], [place], [place]  [address] Phone: [phone]; Fax: [phone]

## 2014-09-09 ENCOUNTER — Ambulatory Visit (INDEPENDENT_AMBULATORY_CARE_PROVIDER_SITE_OTHER): Payer: Commercial Managed Care - HMO | Admitting: Cardiovascular Disease

## 2014-09-09 ENCOUNTER — Encounter: Payer: Self-pay | Admitting: Cardiovascular Disease

## 2014-09-09 VITALS — BP 140/90 | HR 68 | Ht 62.5 in | Wt 167.8 lb

## 2014-09-09 DIAGNOSIS — E041 Nontoxic single thyroid nodule: Secondary | ICD-10-CM | POA: Diagnosis not present

## 2014-09-09 DIAGNOSIS — E785 Hyperlipidemia, unspecified: Secondary | ICD-10-CM

## 2014-09-09 DIAGNOSIS — I10 Essential (primary) hypertension: Secondary | ICD-10-CM

## 2014-09-09 MED ORDER — ROSUVASTATIN CALCIUM 20 MG PO TABS: 20.0000 mg | ORAL_TABLET | Freq: Every day | ORAL | Status: DC

## 2014-09-09 NOTE — Patient Instructions (Signed)
Your physician recommends that you return for lab work in: today (cbc, cmet, hgA1c, tsh, free t4) Your physician recommends that you schedule a follow-up appointment in: next available with Dr. Johnsie Cancel.

## 2014-09-10 LAB — CBC
HCT: 43.7 % (ref 36.0–46.0)
HEMOGLOBIN: 14.6 g/dL (ref 12.0–15.0)
MCHC: 33.4 g/dL (ref 30.0–36.0)
MCV: 84.9 fl (ref 78.0–100.0)
PLATELETS: 266 10*3/uL (ref 150.0–400.0)
RBC: 5.15 Mil/uL — ABNORMAL HIGH (ref 3.87–5.11)
RDW: 14.7 % (ref 11.5–15.5)
WBC: 7.9 10*3/uL (ref 4.0–10.5)

## 2014-09-10 LAB — COMPREHENSIVE METABOLIC PANEL
ALT: 13 U/L (ref 0–35)
AST: 17 U/L (ref 0–37)
Albumin: 4.2 g/dL (ref 3.5–5.2)
Alkaline Phosphatase: 153 U/L — ABNORMAL HIGH (ref 39–117)
BUN: 13 mg/dL (ref 6–23)
CO2: 31 mEq/L (ref 19–32)
Calcium: 9.5 mg/dL (ref 8.4–10.5)
Chloride: 103 mEq/L (ref 96–112)
Creatinine, Ser: 0.83 mg/dL (ref 0.40–1.20)
GFR: 87.69 mL/min (ref >60.00)
Glucose, Bld: 84 mg/dL (ref 70–99)
Potassium: 4.4 mEq/L (ref 3.5–5.1)
Sodium: 139 mEq/L (ref 135–145)
Total Bilirubin: 0.5 mg/dL (ref 0.2–1.2)
Total Protein: 7.2 g/dL (ref 6.0–8.3)

## 2014-09-10 LAB — TSH: TSH: 0.69 u[IU]/mL (ref 0.35–4.50)

## 2014-09-10 LAB — T4, FREE: Free T4: 0.77 ng/dL (ref 0.60–1.60)

## 2014-09-10 LAB — HEMOGLOBIN A1C: HEMOGLOBIN A1C: 6.4 % (ref 4.6–6.5)

## 2014-09-26 ENCOUNTER — Ambulatory Visit: Payer: Commercial Managed Care - HMO | Admitting: Cardiovascular Disease

## 2014-10-05 NOTE — Progress Notes (Signed)
Patient ID: Pamela Small, female   DOB: [birth year], 69 y.o.   MRN: [id number]     Cardiology Office Note   Date:  10/05/2014   ID:  Pamela Small, Pamela Small Pamela Small, Pamela Small, MRN [id number]  PCP:  Kennon Portela, MD  Cardiologist:   Jenkins Rouge, MD   No chief complaint on file.     History of Present Illness: Pamela Small is a 69 y.o. female who presented  for evaluation of chest pain and HTN 08/2014 after 3 year absence  History of HTN, elevated lipids, asthma and lung nodules  Had cardiac CT 10/13In [facility]  No osbstructive disease Study reviewed   IMPRESSION:  1. Mild nonobstructive coronary artery disease, as detailed above. The patient's total coronary artery calcium score is 58 which is the 79th percentile for patient's of matched age, sex and race/ethnicity. 2. Multiple tiny pulmonary nodules scattered throughout the periphery of the lungs bilaterally are favored to reflect areas of mucoid impaction within terminal bronchioles. The largest of these measures up to 3 mm. If the patient is at high risk for bronchogenic carcinoma, follow-up chest CT at 1 year is recommended. If the patient is at low risk, no follow-up is needed. This recommendation follows the consensus statement: Guidelines for Management of Small Pulmonary Nodules Detected on CT Scans: A Statement from the [place] as published in Radiology 2005; 237:395-400. 3. Codominance of the coronary arteries.  Last visit given script for crestor  LDL was 276  Needs f/u CT for pulmonary nodules  Had seen Dr Gwenette Greet in past   Past Medical History  Diagnosis Date  . Hyperlipidemia   . Asthma     Past Surgical History  Procedure Laterality Date  . Abdominal hysterectomy    . Colonoscopy    . Polypectomy    . Foot arthrodesis, modified mcbride       Current Outpatient Prescriptions  Medication Sig Dispense Refill  . rosuvastatin (CRESTOR) 20 MG tablet Take 1 tablet (20 mg total) by mouth  daily. 30 tablet 11   No current facility-administered medications for this visit.    Allergies:   Review of patient's allergies indicates no known allergies.    Social History:  The patient  reports that she has never smoked. She has never used smokeless tobacco. She reports that she does not drink alcohol or use illicit drugs.   Family History:  The patient's family history includes Asthma in her mother; Breast cancer in her daughter; Diabetes in her maternal grandmother; Hyperlipidemia in her mother.    ROS:  Please see the history of present illness.   Otherwise, review of systems are positive for none.   All other systems are reviewed and negative.    PHYSICAL EXAM: VS:  There were no vitals taken for this visit. , BMI There is no weight on file to calculate BMI. GEN: Well nourished, well developed, in no acute distress HEENT: normal Neck: no JVD, carotid bruits, or masses Cardiac:  RRR; no murmurs, rubs, or gallops,no edema  Respiratory:  clear to auscultation bilaterally, normal work of breathing GI: soft, nontender, nondistended, + BS MS: no deformity or atrophy Skin: warm and dry, no rash Neuro:  Strength and sensation are intact Psych: euthymic mood, full affect   EKG:   05/13/12  SR rate 74 normal  09/09/14  SR rate 68  nonspecfic lateral ST changes     Recent Labs: 09/09/2014: ALT 13; BUN 13; Creatinine 0.83; Hemoglobin 14.6; Platelets 266.0;  Potassium 4.4; Sodium 139; TSH 0.69    Lipid Panel    Component Value Date/Time   CHOL 347* 05/10/2012 0955   TRIG 123 05/10/2012 0955   HDL 46 05/10/2012 0955   CHOLHDL 7.5 05/10/2012 0955   VLDL 25 05/10/2012 0955   LDLCALC 276* 05/10/2012 0955      Wt Readings from Last 3 Encounters:  09/09/14 167 lb 12.8 oz (76.114 kg)  07/25/12 168 lb 12.8 oz (76.567 kg)  06/06/12 164 lb 12.8 oz (74.753 kg)      Other studies Reviewed: Additional studies/ records that were reviewed today include: Epic  Records.    ASSESSMENT AND PLAN:  1.  Cholesterol :  Lab work today  Suspect she may need more than one drug.  $3 coupon for 3 month supply given  Encouraged her to see Dr Elder Cyphers  At [facility] as primary.  Will also check CMP, TSH, A1c since she has not had any labs in years  Current medicines are reviewed at length with the patient today.  The patient does not have concerns regarding medicines.  The following changes have been made:  crestor 20 mg   Labs/ tests ordered today include:  Labs  No orders of the defined types were placed in this encounter.     Disposition:   FU with next available  i    Signed, Jenkins Rouge, MD  10/05/2014 8:15 PM    [facility] [address], [place], [place]  [address] Phone: [phone]; Fax: [phone]

## 2014-10-06 DIAGNOSIS — Z1231 Encounter for screening mammogram for malignant neoplasm of breast: Secondary | ICD-10-CM | POA: Diagnosis not present

## 2014-10-07 ENCOUNTER — Ambulatory Visit (INDEPENDENT_AMBULATORY_CARE_PROVIDER_SITE_OTHER): Payer: Commercial Managed Care - HMO | Admitting: Cardiovascular Disease

## 2014-10-07 ENCOUNTER — Encounter: Payer: Commercial Managed Care - HMO | Admitting: Cardiovascular Disease

## 2014-10-07 ENCOUNTER — Encounter: Payer: Self-pay | Admitting: Cardiovascular Disease

## 2014-10-07 VITALS — BP 142/72 | HR 88 | Ht 62.0 in | Wt 171.0 lb

## 2014-10-07 DIAGNOSIS — R9431 Abnormal electrocardiogram [ECG] [EKG]: Secondary | ICD-10-CM | POA: Diagnosis not present

## 2014-10-07 DIAGNOSIS — R0600 Dyspnea, unspecified: Secondary | ICD-10-CM | POA: Diagnosis not present

## 2014-10-07 MED ORDER — AMLODIPINE BESYLATE 5 MG PO TABS: 5.0000 mg | ORAL_TABLET | Freq: Every day | ORAL | Status: DC

## 2014-10-07 NOTE — Patient Instructions (Signed)
Your physician wants you to follow-up in:   [place] will receive a reminder letter in the mail two months in advance. If you don't receive a letter, please call our office to schedule the follow-up appointment. Your physician recommends that you continue on your current medications as directed. Please refer to the Current Medication list given to you today.  Your physician has requested that you have en exercise stress myoview. For further information please visit [email or web address]. Please follow instruction sheet, as given.  Your physician has requested that you have an echocardiogram. Echocardiography is a painless test that uses sound waves to create images of your heart. It provides your doctor with information about the size and shape of your heart and how well your heart's chambers and valves are working. This procedure takes approximately one hour. There are no restrictions for this procedure.

## 2014-10-07 NOTE — Progress Notes (Signed)
Patient ID: Pamela Small, female   DOB: 1946/04/11, 69 y.o.   MRN: [id number]     Cardiology Office Note   Date:  10/07/2014   ID:  Pamela Small August 08, 1945, MRN [id number]  PCP:  Pcp Not In System  Cardiologist:   Jenkins Rouge, MD   Chief Complaint  Patient presents with  . 1 mo f/u visit      History of Present Illness: Pamela Small is a 69 y.o. female who presented  for evaluation of chest pain and HTN 08/2014 after 3 year absence  History of HTN, elevated lipids, asthma and lung nodules  Had cardiac CT 10/13In [facility]  No osbstructive disease Study reviewed   IMPRESSION:  1. Mild nonobstructive coronary artery disease, as detailed above. The patient's total coronary artery calcium score is 58 which is the 79th percentile for patient's of matched age, sex and race/ethnicity. 2. Multiple tiny pulmonary nodules scattered throughout the periphery of the lungs bilaterally are favored to reflect areas of mucoid impaction within terminal bronchioles. The largest of these measures up to 3 mm. If the patient is at high risk for bronchogenic carcinoma, follow-up chest CT at 1 year is recommended. If the patient is at low risk, no follow-up is needed. This recommendation follows the consensus statement: Guidelines for Management of Small Pulmonary Nodules Detected on CT Scans: A Statement from the [place] as published in Radiology 2005; 237:395-400. 3. Codominance of the coronary arteries.  Last visit given script for crestor  LDL was 276  Needs f/u CT for pulmonary nodules  Had seen Dr Gwenette Greet in past  Compliant with statin Still depressed BP has been elevated still  Some SSCP not always with exertion Exertional dyspnea persists   Past Medical History  Diagnosis Date  . Hyperlipidemia   . Asthma     Past Surgical History  Procedure Laterality Date  . Abdominal hysterectomy    . Colonoscopy    . Polypectomy    . Foot arthrodesis, modified  mcbride       Current Outpatient Prescriptions  Medication Sig Dispense Refill  . rosuvastatin (CRESTOR) 20 MG tablet Take 1 tablet (20 mg total) by mouth daily. 30 tablet 11  . amLODipine (NORVASC) 5 MG tablet Take 1 tablet (5 mg total) by mouth daily. 90 tablet 3   No current facility-administered medications for this visit.    Allergies:   Review of patient's allergies indicates no known allergies.    Social History:  The patient  reports that she has never smoked. She has never used smokeless tobacco. She reports that she does not drink alcohol or use illicit drugs.   Family History:  The patient's family history includes Asthma in her mother; Breast cancer in her daughter; Diabetes in her maternal grandmother; Hyperlipidemia in her mother.    ROS:  Please see the history of present illness.   Otherwise, review of systems are positive for none.   All other systems are reviewed and negative.    PHYSICAL EXAM: VS:  BP 142/72 mmHg  Pulse 88  Ht 5\' 2"  (1.575 m)  Wt 171 lb (77.565 kg)  BMI 31.27 kg/m2 , BMI Body mass index is 31.27 kg/(m^2). GEN: Well nourished, well developed, in no acute distress HEENT: normal Neck: no JVD, carotid bruits, or masses Cardiac:  RRR; no murmurs, rubs, or gallops,no edema  Respiratory:  clear to auscultation bilaterally, normal work of breathing GI: soft, nontender, nondistended, + BS MS: no deformity  or atrophy Skin: warm and dry, no rash Neuro:  Strength and sensation are intact Psych: euthymic mood, full affect   EKG:   05/13/12  SR rate 74 normal  09/09/14  SR rate 68  nonspecfic lateral ST changes     Recent Labs: 09/09/2014: ALT 13; BUN 13; Creatinine 0.83; Hemoglobin 14.6; Platelets 266.0; Potassium 4.4; Sodium 139; TSH 0.69    Lipid Panel    Component Value Date/Time   CHOL 347* 05/10/2012 0955   TRIG 123 05/10/2012 0955   HDL 46 05/10/2012 0955   CHOLHDL 7.5 05/10/2012 0955   VLDL 25 05/10/2012 0955   LDLCALC 276*  05/10/2012 0955      Wt Readings from Last 3 Encounters:  10/07/14 171 lb (77.565 kg)  09/09/14 167 lb 12.8 oz (76.114 kg)  07/25/12 168 lb 12.8 oz (76.567 kg)      Other studies Reviewed: Additional studies/ records that were reviewed today include: Epic Records.    ASSESSMENT AND PLAN:  1.  Cholesterol :  On statin now labs in 3 months  2. HTN start norvasc low sodium diet f/u 3 months 3. Depression f/u primary consider starting SSRI 4. Dyspnea:  F/u echo normal exam 5. Chest pain low risk CT 2012  F/u lexiscan myovue   Current medicines are reviewed at length with the patient today.  The patient does not have concerns regarding medicines.  The following changes have been made:  norvasc 5 mg added   Labs/ tests ordered today include:  Labs   Orders Placed This Encounter  Procedures  . Myocardial Perfusion Imaging  . 2D Echocardiogram with contrast     Disposition:   FU with next available  i    Signed, Jenkins Rouge, MD  10/07/2014 7:14 PM    [facility], [place], [place]  [address] Phone: [phone]; Fax: [phone]

## 2014-10-10 ENCOUNTER — Ambulatory Visit (INDEPENDENT_AMBULATORY_CARE_PROVIDER_SITE_OTHER): Payer: Commercial Managed Care - HMO | Admitting: Family Medicine

## 2014-10-10 ENCOUNTER — Encounter: Payer: Self-pay | Admitting: Family Medicine

## 2014-10-10 VITALS — BP 124/84 | HR 83 | Temp 98.4°F | Resp 16 | Ht 62.0 in | Wt 166.0 lb

## 2014-10-10 DIAGNOSIS — E041 Nontoxic single thyroid nodule: Secondary | ICD-10-CM | POA: Diagnosis not present

## 2014-10-10 DIAGNOSIS — I10 Essential (primary) hypertension: Secondary | ICD-10-CM

## 2014-10-10 DIAGNOSIS — N898 Other specified noninflammatory disorders of vagina: Secondary | ICD-10-CM | POA: Diagnosis not present

## 2014-10-10 DIAGNOSIS — A499 Bacterial infection, unspecified: Secondary | ICD-10-CM | POA: Diagnosis not present

## 2014-10-10 DIAGNOSIS — R8290 Unspecified abnormal findings in urine: Secondary | ICD-10-CM | POA: Diagnosis not present

## 2014-10-10 DIAGNOSIS — N760 Acute vaginitis: Secondary | ICD-10-CM

## 2014-10-10 DIAGNOSIS — E785 Hyperlipidemia, unspecified: Secondary | ICD-10-CM

## 2014-10-10 DIAGNOSIS — R8299 Other abnormal findings in urine: Secondary | ICD-10-CM | POA: Diagnosis not present

## 2014-10-10 DIAGNOSIS — B9689 Other specified bacterial agents as the cause of diseases classified elsewhere: Secondary | ICD-10-CM

## 2014-10-10 LAB — POCT UA - MICROSCOPIC ONLY
Casts, Ur, LPF, POC: NEGATIVE
Crystals, Ur, HPF, POC: NEGATIVE
Mucus, UA: POSITIVE
Yeast, UA: NEGATIVE

## 2014-10-10 LAB — COMPLETE METABOLIC PANEL WITHOUT GFR
AST: 17 U/L (ref 0–37)
CO2: 27 meq/L (ref 19–32)
Calcium: 9.5 mg/dL (ref 8.4–10.5)
Chloride: 101 meq/L (ref 96–112)
GFR, Est African American: 80 mL/min
Potassium: 4.7 meq/L (ref 3.5–5.3)
Sodium: 139 meq/L (ref 135–145)
Total Protein: 7 g/dL (ref 6.0–8.3)

## 2014-10-10 LAB — CBC
HCT: 45.5 % (ref 36.0–46.0)
Hemoglobin: 15 g/dL (ref 12.0–15.0)
MCH: 27.8 pg (ref 26.0–34.0)
MCHC: 33 g/dL (ref 30.0–36.0)
MCV: 84.3 fL (ref 78.0–100.0)
MPV: 9.9 fL (ref 8.6–12.4)
Platelets: 285 10*3/uL (ref 150–400)
RBC: 5.4 MIL/uL — ABNORMAL HIGH (ref 3.87–5.11)
RDW: 14.8 % (ref 11.5–15.5)
WBC: 5.8 10*3/uL (ref 4.0–10.5)

## 2014-10-10 LAB — POCT URINALYSIS DIPSTICK
Bilirubin, UA: NEGATIVE
Blood, UA: NEGATIVE
Glucose, UA: NEGATIVE
Ketones, UA: NEGATIVE
Nitrite, UA: NEGATIVE
Protein, UA: NEGATIVE
Spec Grav, UA: 1.02
Urobilinogen, UA: 0.2
pH, UA: 6.5

## 2014-10-10 LAB — POCT WET PREP WITH KOH
KOH Prep POC: NEGATIVE
Trichomonas, UA: NEGATIVE
Yeast Wet Prep HPF POC: NEGATIVE

## 2014-10-10 LAB — LIPID PANEL
Cholesterol: 208 mg/dL — ABNORMAL HIGH (ref 0–200)
HDL: 52 mg/dL (ref >=46)
LDL Cholesterol: 139 mg/dL — ABNORMAL HIGH (ref 0–99)
Total CHOL/HDL Ratio: 4 Ratio
Triglycerides: 86 mg/dL (ref <150)
VLDL: 17 mg/dL (ref 0–40)

## 2014-10-10 LAB — COMPLETE METABOLIC PANEL WITH GFR
ALT: 17 U/L (ref 0–35)
Albumin: 4.3 g/dL (ref 3.5–5.2)
Alkaline Phosphatase: 148 U/L — ABNORMAL HIGH (ref 39–117)
BUN: 16 mg/dL (ref 6–23)
Creat: 0.86 mg/dL (ref 0.50–1.10)
GFR, Est Non African American: 70 mL/min
Glucose, Bld: 93 mg/dL (ref 70–99)
Total Bilirubin: 0.7 mg/dL (ref 0.2–1.2)

## 2014-10-10 MED ORDER — METRONIDAZOLE 500 MG PO TABS: 500.0000 mg | ORAL_TABLET | Freq: Two times a day (BID) | ORAL | Status: DC

## 2014-10-10 NOTE — Progress Notes (Signed)
Chief Complaint:  Chief Complaint  Patient presents with  . Headache  . Hypertension  . Elbow Pain  . Memory Loss    HPI: Pamela COULON is a 69 y.o. female who is here for:  1. Vaginal discharge, she has had it for some time, several months. She had it even when her husband was alive but it wasn't this bad. She tried a douche once. She has no itching. She has some burning. The fat vaginal discharge is malodorous and is white. He does take baths. 2. She lost her husband in October 2015. He was rushed to the hospital and was found to have metastatic stage IV stomach cancer. She wants to make sure she is doing okay. She has good support. His birthday is this weekend. She feels like she is forgetting things. Her TSH was normal about one month ago. She is very worried that she might have some issues that she is not aware of, her husband had stomach pains and didn't get to the doctor to check up on it. She wants to make sure she is okay. She denies chest pain, dizziness, palpitations. She does have a slight headache but it is diffuse. 3. 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 She is on Qvar. 4. She was seen by Dr. Johnsie Cancel for hypertension in February 2016. She has mild coronary artery disease based on his CT angiogram. She is on Norvasc and also Crestor.  Pulmonologist: Dr. Danton Sewer Cardiologist: Dr. Johnsie Cancel   Past Medical History  Diagnosis Date  . Hyperlipidemia   . Asthma   . Lung nodules     seen on CT scan 2013  . Hypertension   . Multiple thyroid nodules     US guided bx was benign in 2013   Past Surgical History  Procedure Laterality Date  . Abdominal hysterectomy    . Colonoscopy    . Polypectomy    . Foot arthrodesis, modified mcbride     History   Social History  . Marital Status: Married    Spouse Name: N/A  . Number of Children: N/A  . Years of Education: N/A   Social History Main Topics  . Smoking status: Never Smoker   . Smokeless  tobacco: Never Used  . Alcohol Use: No  . Drug Use: No  . Sexual Activity: Not on file   Other Topics Concern  . None   Social History Narrative   Family History  Problem Relation Age of Onset  . Breast cancer Daughter   . Hyperlipidemia Mother   . Diabetes Maternal Grandmother   . Asthma Mother    No Known Allergies Prior to Admission medications   Medication Sig Start Date End Date Taking? Authorizing Provider  amLODipine (NORVASC) 5 MG tablet Take 1 tablet (5 mg total) by mouth daily. 10/07/14  Yes Josue Hector, MD  rosuvastatin (CRESTOR) 20 MG tablet Take 1 tablet (20 mg total) by mouth daily. 09/09/14  Yes Josue Hector, MD     ROS: The patient denies fevers, chills, night sweats, unintentional weight loss, chest pain, palpitations, wheezing, dyspnea on exertion, nausea, vomiting, abdominal pain, dysuria, hematuria, melena, numbness, weakness, or tingling.   All other systems have been reviewed and were otherwise negative with the exception of those mentioned in the HPI and as above.    PHYSICAL EXAM: Filed Vitals:   10/10/14 1013  BP: 124/84  Pulse: 83  Temp: 98.4 F (36.9 C)  Resp: 16   Filed Vitals:   10/10/14 1013  Height: 5\' 2"  (1.575 m)  Weight: 166 lb (75.297 kg)   Body mass index is 30.35 kg/(m^2).  General: Alert, no acute distress HEENT:  Normocephalic, atraumatic, oropharynx patent. EOMI, PERRLA. Fullness in the thyroid. Cardiovascular:  Regular rate and rhythm, no rubs murmurs or gallops.  No Carotid bruits, radial pulse intact. No pedal edema.  Respiratory: Clear to auscultation bilaterally.  No wheezes, rales, or rhonchi.  No cyanosis, no use of accessory musculature GI: No organomegaly, abdomen is soft and non-tender, positive bowel sounds.  No masses. Skin: No rashes. Neurologic: Facial musculature symmetric. Psychiatric: Patient is appropriate throughout our interaction. Lymphatic: No cervical lymphadenopathy Musculoskeletal: Gait  intact.   LABS: Results for orders placed or performed in visit on 10/10/14  POCT urinalysis dipstick  Result Value Ref Range   Color, UA yellow    Clarity, UA clear    Glucose, UA neg    Bilirubin, UA neg    Ketones, UA neg    Spec Grav, UA 1.020    Blood, UA neg    pH, UA 6.5    Protein, UA neg    Urobilinogen, UA 0.2    Nitrite, UA neg    Leukocytes, UA small (1+)   POCT UA - Microscopic Only  Result Value Ref Range   WBC, Ur, HPF, POC tntc    RBC, urine, microscopic 3-4    Bacteria, U Microscopic 1+    Mucus, UA pos    Epithelial cells, urine per micros 2-5    Crystals, Ur, HPF, POC neg    Casts, Ur, LPF, POC neg    Yeast, UA neg   POCT Wet Prep with KOH  Result Value Ref Range   Trichomonas, UA Negative    Clue Cells Wet Prep HPF POC tntc    Epithelial Wet Prep HPF POC tntc    Yeast Wet Prep HPF POC neg    Bacteria Wet Prep HPF POC 4+    RBC Wet Prep HPF POC 5-7    WBC Wet Prep HPF POC tntc    KOH Prep POC Negative      EKG/XRAY:   Primary read interpreted by Dr. Marin Comment at [facility].   ASSESSMENT/PLAN: Encounter Diagnoses  Name Primary?  . Vaginal discharge   . Essential hypertension   . Thyroid nodule   . Hyperlipidemia   . Bacterial vaginosis Yes  . Abnormal urine    68 year old African-American female with a past medical history of essential hypertension, hyperlipidemia, or benign thyroid nodules who presents with persistent vaginal discharge consistent with bacterial vaginosis. Prescribed Flagyl 500 mg P0 twice a day 7 days She also had an abnormal urine with trace leukocytes, I suspect this is a contaminant and not actually a full on infection. We will culture her urine. If she has significant growth then we will treat her for a UTI. She will continue on Norvasc and Crestor Basic labs pending: Lipid, CMP, urine culture. I will go ahead and recheck her lipid and CMP since she did not get a full fasting lipid last time and also she was recently restarted on  her Crestor. She still  in bereavement over the loss of her husband in October 2016, this was very sudden. She has good support. She declines any suicidal, homicidal thoughts or hallucinations. She will let me know if she needs any additional medications for depression She previously had a CT of the chest which showed pulmonary nodules  that were felt to be mucous plugs. She is being followed by pulmonology for asthma so I will defer any further advanced imaging studies. She has thyroid nodules which may need to be reevaluated if they increase in size. Currently she is not having any symptoms of hypothyroidism. Her last TSH in February was normal. We will continue to monitor. Follow-up 6 months.   Gross sideeffects, risk and benefits, and alternatives of medications d/w patient. Patient is aware that all medications have potential sideeffects and we are unable to predict every sideeffect or drug-drug interaction that may occur.  Arianni Gallego, [place], DO 10/10/2014 2:16 PM

## 2014-10-13 LAB — URINE CULTURE: Colony Count: >=100000

## 2014-10-14 ENCOUNTER — Telehealth: Payer: Self-pay | Admitting: Family Medicine

## 2014-10-14 ENCOUNTER — Ambulatory Visit (HOSPITAL_COMMUNITY): Payer: Commercial Managed Care - HMO | Attending: Cardiology

## 2014-10-14 DIAGNOSIS — R0600 Dyspnea, unspecified: Secondary | ICD-10-CM | POA: Insufficient documentation

## 2014-10-14 DIAGNOSIS — N390 Urinary tract infection, site not specified: Secondary | ICD-10-CM

## 2014-10-14 DIAGNOSIS — E041 Nontoxic single thyroid nodule: Secondary | ICD-10-CM | POA: Diagnosis not present

## 2014-10-14 DIAGNOSIS — A499 Bacterial infection, unspecified: Secondary | ICD-10-CM

## 2014-10-14 DIAGNOSIS — E785 Hyperlipidemia, unspecified: Secondary | ICD-10-CM | POA: Diagnosis not present

## 2014-10-14 DIAGNOSIS — R062 Wheezing: Secondary | ICD-10-CM | POA: Diagnosis not present

## 2014-10-14 DIAGNOSIS — J45909 Unspecified asthma, uncomplicated: Secondary | ICD-10-CM | POA: Diagnosis not present

## 2014-10-14 DIAGNOSIS — I10 Essential (primary) hypertension: Secondary | ICD-10-CM | POA: Diagnosis not present

## 2014-10-14 MED ORDER — CEPHALEXIN 500 MG PO CAPS: 500.0000 mg | ORAL_CAPSULE | Freq: Two times a day (BID) | ORAL | Status: DC

## 2014-10-14 NOTE — Telephone Encounter (Signed)
Spoke to patient about labs. Will call in keflex for UTI.

## 2014-10-14 NOTE — Progress Notes (Signed)
2D Echo completed. 10/14/2014

## 2014-10-17 ENCOUNTER — Other Ambulatory Visit: Payer: Self-pay | Admitting: Family Medicine

## 2014-10-17 ENCOUNTER — Telehealth: Payer: Self-pay

## 2014-10-17 DIAGNOSIS — I10 Essential (primary) hypertension: Secondary | ICD-10-CM

## 2014-10-17 NOTE — Telephone Encounter (Signed)
Did not mean to close encounter.  Dr Lovie Macadamia messages: patient needs referral to Dr Jenkins Rouge.

## 2014-10-17 NOTE — Telephone Encounter (Signed)
Dr Marin Comment: Is this ok?

## 2014-10-17 NOTE — Telephone Encounter (Signed)
The patient came into 42 today to request that Dr. Marin Comment give her a referral to Dr. Jenkins Rouge so that insurance will cover her last visit and any future Cardiology visits with him.  The patient has an appointment coming up on 10/27/14 with Dr. Johnsie Cancel.  Please call the patient at [phone] or [phone].

## 2014-10-21 ENCOUNTER — Encounter (HOSPITAL_COMMUNITY): Payer: Commercial Managed Care - HMO

## 2014-10-27 ENCOUNTER — Encounter: Payer: Self-pay | Admitting: Family Medicine

## 2014-10-27 ENCOUNTER — Ambulatory Visit (HOSPITAL_COMMUNITY): Payer: Commercial Managed Care - HMO | Attending: Cardiology | Admitting: Radiology

## 2014-10-27 DIAGNOSIS — R0600 Dyspnea, unspecified: Secondary | ICD-10-CM | POA: Diagnosis not present

## 2014-10-27 DIAGNOSIS — R9431 Abnormal electrocardiogram [ECG] [EKG]: Secondary | ICD-10-CM | POA: Insufficient documentation

## 2014-10-27 MED ORDER — TECHNETIUM TC 99M SESTAMIBI GENERIC - CARDIOLITE
11.0000 | Freq: Once | INTRAVENOUS | Status: AC | PRN
  Administered 2014-10-27: 11 via INTRAVENOUS

## 2014-10-27 MED ORDER — TECHNETIUM TC 99M SESTAMIBI GENERIC - CARDIOLITE
30.0000 | Freq: Once | INTRAVENOUS | Status: AC | PRN
  Administered 2014-10-27: 30 via INTRAVENOUS

## 2014-10-27 NOTE — Progress Notes (Signed)
[address] [place], [place] [address] [phone]    Cardiology Nuclear Med Study  Pamela Small is a 69 y.o. female     MRN : [id number]     DOB: Jun 17, 1946  Procedure Date: 10/27/2014  Nuclear Med Background Indication for Stress Test:  Evaluation for Ischemia History:  N/A Cardiac Risk Factors: Hypertension and Lipids  Symptoms:  Chest Pain and DOE   Nuclear Pre-Procedure Caffeine/Decaff Intake:  None> 12 hrs NPO After: 6:30pm   Lungs:  clear O2 Sat: 93% on room air. IV 0.9% NS with Angio Cath:  22g  IV Site: R Antecubital x 1, tolerated well IV Started by:  Irven Baltimore, RN  Chest Size (in):  36 Cup Size: B  Height: 5\' 2"  (1.575 m)  Weight:  165 lb (74.844 kg)  BMI:  Body mass index is 30.17 kg/(m^2). Tech Comments:  N/A    Nuclear Med Study 1 or 2 day study: 1 day  Stress Test Type:  Stress  Reading MD: N/A  Order Authorizing Provider:  Jenkins Rouge, MD  Resting Radionuclide: Technetium 2m Sestamibi  Resting Radionuclide Dose: 11.0 mCi   Stress Radionuclide:  Technetium 70m Sestamibi  Stress Radionuclide Dose: 33.0 mCi           Stress Protocol Rest HR: 68 Stress HR: 131  Rest BP: 127/89 Stress BP: 121/86  Exercise Time (min): 4:59 METS: 6.9   Predicted Max HR: 151 bpm % Max HR: 86.75 bpm Rate Pressure Product: 16637   Dose of Adenosine (mg):  n/a Dose of Lexiscan: n/a mg  Dose of Atropine (mg): n/a Dose of Dobutamine: n/a mcg/kg/min (at max HR)  Stress Test Technologist: Crissie Figures, RN  Nuclear Technologist:  Earl Many, CNMT     Rest Procedure:  Myocardial perfusion imaging was performed at rest 45 minutes following the intravenous administration of Technetium 5m Sestamibi. Rest ECG: NSR with non-specific ST-T wave changes  Stress Procedure:  The patient exercised on the treadmill utilizing the Bruce Protocol for 4:59 minutes. The patient stopped due to Dyspnea and denied any chest pain.  Technetium  48m Sestamibi was injected at peak exercise and myocardial perfusion imaging was performed after a brief delay. Stress ECG: No significant change from baseline ECG  QPS Raw Data Images:  Normal; no motion artifact; normal heart/lung ratio. Stress Images:  Normal homogeneous uptake in all areas of the myocardium. Rest Images:  Normal homogeneous uptake in all areas of the myocardium. Subtraction (SDS):  No evidence of ischemia. Transient Ischemic Dilatation (Normal <1.22):  0.93 Lung/Heart Ratio (Normal <0.45):  0.37  Quantitative Gated Spect Images QGS EDV:  53 ml QGS ESV:  13 ml  Impression Exercise Capacity:  Fair exercise capacity. BP Response:  Hypotensive blood pressure response. Clinical Symptoms:  The exercise was limited by dyspnea. ECG Impression:  No significant ST segment change suggestive of ischemia. Comparison with Prior Nuclear Study: No images to compare  Overall Impression:  Low risk stress nuclear study with normal perfusion images. Hypotensive response to exercise is a poor prognostic featurs and the possibility of balanced ischemia, pulmonary hypertension or valvular disease should be considered.  LV Ejection Fraction: 76%.  LV Wall Motion:  NL LV Function; NL Wall Motion   Sanda Klein, MD, [facility] [phone] office [phone] pager

## 2015-01-28 NOTE — Progress Notes (Signed)
Patient ID: Pamela Small, female   DOB: May 19, 1946, 69 y.o.   MRN: [id number]     Cardiology Office Note   Date:  01/28/2015   ID:  Vertie, Dibbern 12-30-1945, MRN [id number]  PCP:  Pcp Not In System  Cardiologist:   Jenkins Rouge, MD   No chief complaint on file.     History of Present Illness: Pamela Small is a 69 y.o. female who presented  for evaluation of chest pain and HTN 08/2014  History of HTN, elevated lipids, asthma and lung nodules  Had cardiac CT 10/13 In CD[facility]  No osbstructive disease Study reviewed   IMPRESSION:  1. Mild nonobstructive coronary artery disease, as detailed above. The patient's total coronary artery calcium score is 58 which is the 79th percentile for patient's of matched age, sex and race/ethnicity. 2. Multiple tiny pulmonary nodules scattered throughout the periphery of the lungs bilaterally are favored to reflect areas of mucoid impaction within terminal bronchioles. The largest of these measures up to 3 mm. If the patient is at high risk for bronchogenic carcinoma, follow-up chest CT at 1 year is recommended. If the patient is at low risk, no follow-up is needed. This recommendation follows the consensus statement: Guidelines for Management of Small Pulmonary Nodules Detected on CT Scans: A Statement from the [place] as published in Radiology 2005; 237:395-400. 3. Codominance of the coronary arteries.  Last visit given script for crestor  LDL was 276  Needs f/u CT for pulmonary nodules  Had seen Dr Gwenette Greet in past  Compliant with statin Still depressed BP has been elevated still  Some SSCP not always with exertion Exertional dyspnea persists   F/U myovue reviewed  10/28/14 normal perfusion no ischemia F/U Echo reviewed 10/14/14  Normal EF 65% MAC with mild MR estimated PA pressure 38 mmHg   Past Medical History  Diagnosis Date  . Hyperlipidemia   . Asthma   . Lung nodules     seen on CT scan 2013  .  Hypertension   . Multiple thyroid nodules     US guided bx was benign in 2013    Past Surgical History  Procedure Laterality Date  . Abdominal hysterectomy    . Colonoscopy    . Polypectomy    . Foot arthrodesis, modified mcbride       Current Outpatient Prescriptions  Medication Sig Dispense Refill  . amLODipine (NORVASC) 5 MG tablet Take 1 tablet (5 mg total) by mouth daily. 90 tablet 3  . cephALEXin (KEFLEX) 500 MG capsule Take 1 capsule (500 mg total) by mouth 2 (two) times daily. 14 capsule 0  . metroNIDAZOLE (FLAGYL) 500 MG tablet Take 1 tablet (500 mg total) by mouth 2 (two) times daily. 14 tablet 0  . rosuvastatin (CRESTOR) 20 MG tablet Take 1 tablet (20 mg total) by mouth daily. 30 tablet 11   No current facility-administered medications for this visit.    Allergies:   Review of patient's allergies indicates no known allergies.    Social History:  The patient  reports that she has never smoked. She has never used smokeless tobacco. She reports that she does not drink alcohol or use illicit drugs.   Family History:  The patient's family history includes Asthma in her mother; Breast cancer in her daughter; Diabetes in her maternal grandmother; Hyperlipidemia in her mother.    ROS:  Please see the history of present illness.   Otherwise, review of systems are positive for  none.   All other systems are reviewed and negative.    PHYSICAL EXAM: VS:  There were no vitals taken for this visit. , BMI There is no weight on file to calculate BMI. GEN: Well nourished, well developed, in no acute distress HEENT: normal Neck: no JVD, carotid bruits, or masses Cardiac:  RRR; no murmurs, rubs, or gallops,no edema  Respiratory:  clear to auscultation bilaterally, normal work of breathing GI: soft, nontender, nondistended, + BS MS: no deformity or atrophy Skin: warm and dry, no rash Neuro:  Strength and sensation are intact Psych: euthymic mood, full affect   EKG:   05/13/12   SR rate 74 normal  09/09/14  SR rate 68  nonspecfic lateral ST changes     Recent Labs: 09/09/2014: TSH 0.69 10/10/2014: ALT 17; BUN 16; Creat 0.86; Hemoglobin 15.0; Platelets 285; Potassium 4.7; Sodium 139    Lipid Panel    Component Value Date/Time   CHOL 208* 10/10/2014 1224   TRIG 86 10/10/2014 1224   HDL 52 10/10/2014 1224   CHOLHDL 4.0 10/10/2014 1224   VLDL 17 10/10/2014 1224   LDLCALC 139* 10/10/2014 1224      Wt Readings from Last 3 Encounters:  10/27/14 74.844 kg (165 lb)  10/10/14 75.297 kg (166 lb)  10/07/14 77.565 kg (171 lb)      Other studies Reviewed: Additional studies/ records that were reviewed today include: Epic Records.    ASSESSMENT AND PLAN:  1.  Cholesterol :  On statin now labs in 3 months  2. HTN start norvasc low sodium diet f/u 3 months 3. Depression f/u primary consider starting SSRI 4. Dyspnea:  F/u echo normal exam 5. Chest pain low risk CT 2012  F/u lexiscan myovue   Current medicines are reviewed at length with the patient today.  The patient does not have concerns regarding medicines.  The following changes have been made:  norvasc 5 mg added   Labs/ tests ordered today include:  Labs   No orders of the defined types were placed in this encounter.     Disposition:   FU with me in a year     Signed, Jenkins Rouge, MD  01/28/2015 1:59 PM    [facility] [address], [place], [place]  [address] Phone: [phone]; Fax: [phone]

## 2015-02-02 ENCOUNTER — Encounter: Payer: Commercial Managed Care - HMO | Admitting: Cardiovascular Disease

## 2015-03-19 ENCOUNTER — Encounter: Payer: Self-pay | Admitting: Cardiovascular Disease

## 2015-03-19 ENCOUNTER — Ambulatory Visit (INDEPENDENT_AMBULATORY_CARE_PROVIDER_SITE_OTHER): Payer: Commercial Managed Care - HMO | Admitting: Cardiovascular Disease

## 2015-03-19 VITALS — BP 132/98 | HR 96 | Ht 62.0 in | Wt 166.0 lb

## 2015-03-19 DIAGNOSIS — I10 Essential (primary) hypertension: Secondary | ICD-10-CM | POA: Diagnosis not present

## 2015-03-19 NOTE — Progress Notes (Signed)
Patient ID: Pamela Small, female   DOB: [birth year], 69 y.o.   MRN: [id number]     Cardiology Office Note   Date:  03/19/2015   ID:  Pamela Small, Pamela Small Nov 20, 1945, MRN [id number]  PCP:  Pcp Not In System  Cardiologist:   Jenkins Rouge, MD   No chief complaint on file.     History of Present Illness: Pamela Small is a 69 y.o. female who presented  for evaluation of chest pain and HTN 08/2014 after 3 year absence  History of HTN, elevated lipids, asthma and lung nodules  Had cardiac CT 10/13In [facility]  No osbstructive disease Study reviewed   IMPRESSION:  1. Mild nonobstructive coronary artery disease, as detailed above. The patient's total coronary artery calcium score is 58 which is the 79th percentile for patient's of matched age, sex and race/ethnicity. 2. Multiple tiny pulmonary nodules scattered throughout the periphery of the lungs bilaterally are favored to reflect areas of mucoid impaction within terminal bronchioles. The largest of these measures up to 3 mm. If the patient is at high risk for bronchogenic carcinoma, follow-up chest CT at 1 year is recommended. If the patient is at low risk, no follow-up is needed. This recommendation follows the consensus statement: Guidelines for Management of Small Pulmonary Nodules Detected on CT Scans: A Statement from the [place] as published in Radiology 2005; 237:395-400. 3. Codominance of the coronary arteries.  F/U echo and myovue reviewed: 10/14/14  Study Conclusions  - Left ventricle: The cavity size was normal. Systolic function was normal. The estimated ejection fraction was in the range of 60% to 65%. Wall motion was normal; there were no regional wall motion abnormalities. Left ventricular diastolic function parameters were normal for the patient&'s age. - Mitral valve: Calcified annulus. Mildly calcified leaflets . There was mild regurgitation. - Pulmonary arteries: Systolic  pressure was mildly increased. PA peak pressure: 36 mm Hg (S).  Myovue normal EF 76%  No ishemia or infarct     Last visit given script for crestor  LDL was 276  Needs f/u CT for pulmonary nodules  Had seen Dr Gwenette Greet in past  Compliant with statin Still depressed BP has been elevated still  Some SSCP not always with exertion Exertional dyspnea persists   Past Medical History  Diagnosis Date  . Hyperlipidemia   . Asthma   . Lung nodules     seen on CT scan 2013  . Hypertension   . Multiple thyroid nodules     US guided bx was benign in 2013    Past Surgical History  Procedure Laterality Date  . Abdominal hysterectomy    . Colonoscopy    . Polypectomy    . Foot arthrodesis, modified mcbride       Current Outpatient Prescriptions  Medication Sig Dispense Refill  . amLODipine (NORVASC) 5 MG tablet Take 1 tablet (5 mg total) by mouth daily. 90 tablet 3  . rosuvastatin (CRESTOR) 20 MG tablet Take 1 tablet (20 mg total) by mouth daily. 30 tablet 11   No current facility-administered medications for this visit.    Allergies:   Review of patient's allergies indicates no known allergies.    Social History:  The patient  reports that she has never smoked. She has never used smokeless tobacco. She reports that she does not drink alcohol or use illicit drugs.   Family History:  The patient's family history includes Asthma in her mother; Breast cancer in her daughter; Diabetes  in her maternal grandmother; Hyperlipidemia in her mother.    ROS:  Please see the history of present illness.   Otherwise, review of systems are positive for none.   All other systems are reviewed and negative.    PHYSICAL EXAM: VS:  BP 132/98 mmHg  Pulse 96  Ht 5\' 2"  (1.575 m)  Wt 75.297 kg (166 lb)  BMI 30.35 kg/m2 , BMI Body mass index is 30.35 kg/(m^2). GEN: Well nourished, well developed, in no acute distress HEENT: mild congestion and erythema of soft pallate  Neck: no JVD, carotid bruits, or  masses Cardiac:  RRR; no murmurs, rubs, or gallops,no edema  Respiratory:  midl expitory wheezing   GI: soft, nontender, nondistended, + BS MS: no deformity or atrophy Skin: warm and dry, no rash Neuro:  Strength and sensation are intact Psych: euthymic mood, full affect   EKG:   05/13/12  SR rate 74 normal  09/09/14  SR rate 68  nonspecfic lateral ST changes     Recent Labs: 09/09/2014: TSH 0.69 10/10/2014: ALT 17; BUN 16; Creat 0.86; Hemoglobin 15.0; Platelets 285; Potassium 4.7; Sodium 139    Lipid Panel    Component Value Date/Time   CHOL 208* 10/10/2014 1224   TRIG 86 10/10/2014 1224   HDL 52 10/10/2014 1224   CHOLHDL 4.0 10/10/2014 1224   VLDL 17 10/10/2014 1224   LDLCALC 139* 10/10/2014 1224      Wt Readings from Last 3 Encounters:  03/19/15 75.297 kg (166 lb)  10/27/14 74.844 kg (165 lb)  10/10/14 75.297 kg (166 lb)      Other studies Reviewed: Additional studies/ records that were reviewed today include: Epic Records.    ASSESSMENT AND PLAN:  1.  Cholesterol :  On statin now labs in 3 months  2. HTN  Non compliant with norvasc encouraged her to take med in am  3. Depression f/u primary consider starting SSRI 4. Dyspnea: Echo normal  normal exam no cardiac source f/u CT with Dr Gwenette Greet for pulmonary nodules  5. Chest pain low risk CT 2012  Normal myovue obxerve   Current medicines are reviewed at length with the patient today.  The patient does not have concerns regarding medicines.  The following changes have been made:  None   Labs/ tests ordered today include:  Labs   No orders of the defined types were placed in this encounter.     Disposition:   FU with me in a year     Signed, Jenkins Rouge, MD  03/19/2015 10:13 AM    [facility], [place], [place]  [address] Phone: [phone]; Fax: [phone]

## 2015-03-19 NOTE — Patient Instructions (Signed)
Medication Instructions:  NO CHANGES  Labwork: NONE  Testing/Procedures: NONE  Follow-Up: Your physician wants you to follow-up in: YEAR WITH  DR NISHAN You will receive a reminder letter in the mail two months in advance. If you don't receive a letter, please call our office to schedule the follow-up appointment.   Any Other Special Instructions Will Be Listed Below (If Applicable).   

## 2015-05-01 ENCOUNTER — Ambulatory Visit (INDEPENDENT_AMBULATORY_CARE_PROVIDER_SITE_OTHER): Payer: Commercial Managed Care - HMO | Admitting: Family Medicine

## 2015-05-01 VITALS — BP 138/90 | HR 77 | Temp 98.0°F | Resp 16 | Ht 63.0 in | Wt 166.0 lb

## 2015-05-01 DIAGNOSIS — R079 Chest pain, unspecified: Secondary | ICD-10-CM

## 2015-05-01 DIAGNOSIS — J4520 Mild intermittent asthma, uncomplicated: Secondary | ICD-10-CM

## 2015-05-01 DIAGNOSIS — I10 Essential (primary) hypertension: Secondary | ICD-10-CM

## 2015-05-01 DIAGNOSIS — Z634 Disappearance and death of family member: Secondary | ICD-10-CM | POA: Diagnosis not present

## 2015-05-01 DIAGNOSIS — E785 Hyperlipidemia, unspecified: Secondary | ICD-10-CM

## 2015-05-01 DIAGNOSIS — R300 Dysuria: Secondary | ICD-10-CM | POA: Diagnosis not present

## 2015-05-01 DIAGNOSIS — N898 Other specified noninflammatory disorders of vagina: Secondary | ICD-10-CM | POA: Diagnosis not present

## 2015-05-01 LAB — COMPLETE METABOLIC PANEL WITHOUT GFR
AST: 18 U/L (ref 10–35)
Alkaline Phosphatase: 153 U/L — ABNORMAL HIGH (ref 33–130)
BUN: 14 mg/dL (ref 7–25)
CO2: 28 mmol/L (ref 20–31)
GFR, Est Non African American: 74 mL/min (ref >=60)
Glucose, Bld: 75 mg/dL (ref 65–99)
Potassium: 4.3 mmol/L (ref 3.5–5.3)
Sodium: 139 mmol/L (ref 135–146)

## 2015-05-01 LAB — POCT WET + KOH PREP
Trich by wet prep: ABSENT
Yeast by KOH: ABSENT
Yeast by wet prep: ABSENT

## 2015-05-01 LAB — POC MICROSCOPIC URINALYSIS (UMFC): Mucus: ABSENT

## 2015-05-01 LAB — LIPID PANEL
Cholesterol: 325 mg/dL — ABNORMAL HIGH (ref 125–200)
HDL: 57 mg/dL (ref >=46)
LDL Cholesterol: 249 mg/dL — ABNORMAL HIGH (ref <130)
Total CHOL/HDL Ratio: 5.7 Ratio — ABNORMAL HIGH (ref <=5.0)
Triglycerides: 93 mg/dL (ref <150)
VLDL: 19 mg/dL (ref <30)

## 2015-05-01 LAB — COMPLETE METABOLIC PANEL WITH GFR
ALT: 16 U/L (ref 6–29)
Albumin: 4.3 g/dL (ref 3.6–5.1)
Calcium: 9.7 mg/dL (ref 8.6–10.4)
Chloride: 101 mmol/L (ref 98–110)
Creat: 0.81 mg/dL (ref 0.50–0.99)
GFR, Est African American: 86 mL/min (ref >=60)
Total Bilirubin: 0.8 mg/dL (ref 0.2–1.2)
Total Protein: 7.2 g/dL (ref 6.1–8.1)

## 2015-05-01 LAB — POCT URINALYSIS DIP (MANUAL ENTRY)
Bilirubin, UA: NEGATIVE
Blood, UA: NEGATIVE
Glucose, UA: NEGATIVE
Ketones, POC UA: NEGATIVE
Leukocytes, UA: NEGATIVE
Nitrite, UA: NEGATIVE
Protein Ur, POC: NEGATIVE
Spec Grav, UA: 1.02
Urobilinogen, UA: 0.2
pH, UA: 5.5

## 2015-05-01 LAB — POCT CBC
Granulocyte percent: 62.3 % (ref 37–80)
HCT, POC: 44 % (ref 37.7–47.9)
Hemoglobin: 14.5 g/dL (ref 12.2–16.2)
Lymph, poc: 2.5 (ref 0.6–3.4)
MCH, POC: 28 pg (ref 27–31.2)
MCHC: 33 g/dL (ref 31.8–35.4)
MCV: 84.7 fL (ref 80–97)
MID (cbc): 0.2 (ref 0–0.9)
MPV: 7.3 fL (ref 0–99.8)
POC Granulocyte: 4.5 (ref 2–6.9)
POC LYMPH PERCENT: 34.5 % (ref 10–50)
POC MID %: 3.2 % (ref 0–12)
Platelet Count, POC: 255 10*3/uL (ref 142–424)
RBC: 5.19 M/uL (ref 4.04–5.48)
RDW, POC: 14.7 %
WBC: 7.3 10*3/uL (ref 4.6–10.2)

## 2015-05-01 LAB — GLUCOSE, POCT (MANUAL RESULT ENTRY): POC Glucose: 66 mg/dl — AB (ref 70–99)

## 2015-05-01 LAB — TSH: TSH: 0.917 u[IU]/mL (ref 0.350–4.500)

## 2015-05-01 MED ORDER — ALBUTEROL SULFATE HFA 108 (90 BASE) MCG/ACT IN AERS: 2.0000 | INHALATION_SPRAY | Freq: Four times a day (QID) | RESPIRATORY_TRACT | Status: DC | PRN

## 2015-05-01 MED ORDER — LOVASTATIN 20 MG PO TABS: 20.0000 mg | ORAL_TABLET | Freq: Every day | ORAL | Status: DC

## 2015-05-01 NOTE — Patient Instructions (Signed)
DASH Eating Plan °DASH stands for "Dietary Approaches to Stop Hypertension." The DASH eating plan is a healthy eating plan that has been shown to reduce high blood pressure (hypertension). Additional health benefits may include reducing the risk of type 2 diabetes mellitus, heart disease, and stroke. The DASH eating plan may also help with weight loss. °WHAT DO I NEED TO KNOW ABOUT THE DASH EATING PLAN? °For the DASH eating plan, you will follow these general guidelines: °· Choose foods with a percent daily value for sodium of less than 5% (as listed on the food label). °· Use salt-free seasonings or herbs instead of table salt or sea salt. °· Check with your health care provider or pharmacist before using salt substitutes. °· Eat lower-sodium products, often labeled as "lower sodium" or "no salt added." °· Eat fresh foods. °· Eat more vegetables, fruits, and low-fat dairy products. °· Choose whole grains. Look for the word "whole" as the first word in the ingredient list. °· Choose fish and skinless chicken or turkey more often than red meat. Limit fish, poultry, and meat to 6 oz (170 g) each day. °· Limit sweets, desserts, sugars, and sugary drinks. °· Choose heart-healthy fats. °· Limit cheese to 1 oz (28 g) per day. °· Eat more home-cooked food and less restaurant, buffet, and fast food. °· Limit fried foods. °· Cook foods using methods other than frying. °· Limit canned vegetables. If you do use them, rinse them well to decrease the sodium. °· When eating at a restaurant, ask that your food be prepared with less salt, or no salt if possible. °WHAT FOODS CAN I EAT? °Seek help from a dietitian for individual calorie needs. °Grains °Whole grain or whole wheat bread. Brown rice. Whole grain or whole wheat pasta. Quinoa, bulgur, and whole grain cereals. Low-sodium cereals. Corn or whole wheat flour tortillas. Whole grain cornbread. Whole grain crackers. Low-sodium crackers. °Vegetables °Fresh or frozen vegetables  (raw, steamed, roasted, or grilled). Low-sodium or reduced-sodium tomato and vegetable juices. Low-sodium or reduced-sodium tomato sauce and paste. Low-sodium or reduced-sodium canned vegetables.  °Fruits °All fresh, canned (in natural juice), or frozen fruits. °Meat and Other Protein Products °Ground beef (85% or leaner), grass-fed beef, or beef trimmed of fat. Skinless chicken or turkey. Ground chicken or turkey. Pork trimmed of fat. All fish and seafood. Eggs. Dried beans, peas, or lentils. Unsalted nuts and seeds. Unsalted canned beans. °Dairy °Low-fat dairy products, such as skim or 1% milk, 2% or reduced-fat cheeses, low-fat ricotta or cottage cheese, or plain low-fat yogurt. Low-sodium or reduced-sodium cheeses. °Fats and Oils °Tub margarines without trans fats. Light or reduced-fat mayonnaise and salad dressings (reduced sodium). Avocado. Safflower, olive, or canola oils. Natural peanut or almond butter. °Other °Unsalted popcorn and pretzels. °The items listed above may not be a complete list of recommended foods or beverages. Contact your dietitian for more options. °WHAT FOODS ARE NOT RECOMMENDED? °Grains °White bread. White pasta. White rice. Refined cornbread. Bagels and croissants. Crackers that contain trans fat. °Vegetables °Creamed or fried vegetables. Vegetables in a cheese sauce. Regular canned vegetables. Regular canned tomato sauce and paste. Regular tomato and vegetable juices. °Fruits °Dried fruits. Canned fruit in light or heavy syrup. Fruit juice. °Meat and Other Protein Products °Fatty cuts of meat. Ribs, chicken wings, bacon, sausage, bologna, salami, chitterlings, fatback, hot dogs, bratwurst, and packaged luncheon meats. Salted nuts and seeds. Canned beans with salt. °Dairy °Whole or 2% milk, cream, half-and-half, and cream cheese. Whole-fat or sweetened yogurt. Full-fat   cheeses or blue cheese. Nondairy creamers and whipped toppings. Processed cheese, cheese spreads, or cheese  curds. °Condiments °Onion and garlic salt, seasoned salt, table salt, and sea salt. Canned and packaged gravies. Worcestershire sauce. Tartar sauce. Barbecue sauce. Teriyaki sauce. Soy sauce, including reduced sodium. Steak sauce. Fish sauce. Oyster sauce. Cocktail sauce. Horseradish. Ketchup and mustard. Meat flavorings and tenderizers. Bouillon cubes. Hot sauce. Tabasco sauce. Marinades. Taco seasonings. Relishes. °Fats and Oils °Butter, stick margarine, lard, shortening, ghee, and bacon fat. Coconut, palm kernel, or palm oils. Regular salad dressings. °Other °Pickles and olives. Salted popcorn and pretzels. °The items listed above may not be a complete list of foods and beverages to avoid. Contact your dietitian for more information. °WHERE CAN I FIND MORE INFORMATION? °National Heart, Lung, [employer] Institute: www.nhlbi.nih.gov/health/health-topics/topics/dash/ °  °This information is not intended to replace advice given to you by your health care provider. Make sure you discuss any questions you have with your health care provider. °  °Document Released: 06/23/2011 Document Revised: 07/25/2014 Document Reviewed: 05/08/2013 °Elsevier Interactive Patient Education ©2016 Elsevier Inc. ° °Hypertension °Hypertension, commonly called high blood pressure, is when the force of blood pumping through your arteries is too strong. Your arteries are the blood vessels that carry blood from your heart throughout your body. A blood pressure reading consists of a higher number over a lower number, such as 110/72. The higher number (systolic) is the pressure inside your arteries when your heart pumps. The lower number (diastolic) is the pressure inside your arteries when your heart relaxes. Ideally you want your blood pressure below 120/80. °Hypertension forces your heart to work harder to pump blood. Your arteries may become narrow or stiff. Having untreated or uncontrolled hypertension can cause heart attack, stroke, kidney  disease, and other problems. °RISK FACTORS °Some risk factors for high blood pressure are controllable. Others are not.  °Risk factors you cannot control include:  °· Race. You may be at higher risk if you are African American. °· Age. Risk increases with age. °· Gender. Men are at higher risk than women before age 45 years. After age 65, women are at higher risk than men. °Risk factors you can control include: °· Not getting enough exercise or physical activity. °· Being overweight. °· Getting too much fat, sugar, calories, or salt in your diet. °· Drinking too much alcohol. °SIGNS AND SYMPTOMS °Hypertension does not usually cause signs or symptoms. Extremely high blood pressure (hypertensive crisis) may cause headache, anxiety, shortness of breath, and nosebleed. °DIAGNOSIS °To check if you have hypertension, your health care provider will measure your blood pressure while you are seated, with your arm held at the level of your heart. It should be measured at least twice using the same arm. Certain conditions can cause a difference in blood pressure between your right and left arms. A blood pressure reading that is higher than normal on one occasion does not mean that you need treatment. If it is not clear whether you have high blood pressure, you may be asked to return on a different day to have your blood pressure checked again. Or, you may be asked to monitor your blood pressure at home for 1 or more weeks. °TREATMENT °Treating high blood pressure includes making lifestyle changes and possibly taking medicine. Living a healthy lifestyle can help lower high blood pressure. You may need to change some of your habits. °Lifestyle changes may include: °· Following the DASH diet. This diet is high in fruits, vegetables, and whole   grains. It is low in salt, red meat, and added sugars. °· Keep your sodium intake below 2,300 mg per day. °· Getting at least 30-45 minutes of aerobic exercise at least 4 times per  week. °· Losing weight if necessary. °· Not smoking. °· Limiting alcoholic beverages. °· Learning ways to reduce stress. °Your health care provider may prescribe medicine if lifestyle changes are not enough to get your blood pressure under control, and if one of the following is true: °· You are 18-59 years of age and your systolic blood pressure is above 140. °· You are 60 years of age or older, and your systolic blood pressure is above 150. °· Your diastolic blood pressure is above 90. °· You have diabetes, and your systolic blood pressure is over 140 or your diastolic blood pressure is over 90. °· You have kidney disease and your blood pressure is above 140/90. °· You have heart disease and your blood pressure is above 140/90. °Your personal target blood pressure may vary depending on your medical conditions, your age, and other factors. °HOME CARE INSTRUCTIONS °· Have your blood pressure rechecked as directed by your health care provider.   °· Take medicines only as directed by your health care provider. Follow the directions carefully. Blood pressure medicines must be taken as prescribed. The medicine does not work as well when you skip doses. Skipping doses also puts you at risk for problems. °· Do not smoke.   °· Monitor your blood pressure at home as directed by your health care provider.  °SEEK MEDICAL CARE IF:  °· You think you are having a reaction to medicines taken. °· You have recurrent headaches or feel dizzy. °· You have swelling in your ankles. °· You have trouble with your vision. °SEEK IMMEDIATE MEDICAL CARE IF: °· You develop a severe headache or confusion. °· You have unusual weakness, numbness, or feel faint. °· You have severe chest or abdominal pain. °· You vomit repeatedly. °· You have trouble breathing. °MAKE SURE YOU:  °· Understand these instructions. °· Will watch your condition. °· Will get help right away if you are not doing well or get worse. °  °This information is not intended to  replace advice given to you by your health care provider. Make sure you discuss any questions you have with your health care provider. °  °Document Released: 07/04/2005 Document Revised: 11/18/2014 Document Reviewed: 04/26/2013 °Elsevier Interactive Patient Education ©2016 Elsevier Inc. ° °

## 2015-05-01 NOTE — Progress Notes (Signed)
 Chief Complaint:  Chief Complaint  Patient presents with  . Follow-up    vagina infection, x 1-2 months  . Medication Refill    Crestor 20mg     HPI: Pamela Small is a 69 y.o. female who reports to [facility] today complaining of : 1. Asthma sxs, she has been wheezing.  3 nights ago she feels like she was wheezing more , she does nto have any albuterol inhalers left. She would like a refill, only takes it intermittently with weather cahgnes or if sick. . 2. She feels depressed a little, the aniiversary of her husbnad's one year death is coming up, he die unexpectedly while ding some maintenace work, is depressed when she thinks about it, however still able to function, she has good support from siblings and also son and church. She decliens to be on any antidepressants. She denies SI/HI/Hallcuiantions.  3. She has had vaginal dc, she had some yellow stain. She has some burning. No blood. Not sexually active, she has some vaginal dryness, she ahs had no abnormal paps.  4. She had some CP intermittent, lasting a few seconds this started just toady, has been working [employer] the house moving things, she also feels it when she thinks about her husband. No other sxs. No HA, vision changes, n/v/abd pain, diaphoreseis, n/w/t   Has been seen by cardiology in 03/2015.  Recent echo 09/2014 Study Conclusions  - Left ventricle: The cavity size was normal. Systolic function was normal. The estimated ejection fraction was in the range of 60% to 65%. Wall motion was normal; there were no regional wall motion abnormalities. Left ventricular diastolic function parameters were normal for the patient&'s age. - Mitral valve: Calcified annulus. Mildly calcified leaflets . There was mild regurgitation. - Pulmonary arteries: Systolic pressure was mildly increased. PA peak pressure: 36 mm Hg (S).  Past Medical History  Diagnosis Date  . Hyperlipidemia   . Asthma   . Lung nodules     seen  on CT scan 2013  . Hypertension   . Multiple thyroid nodules     US guided bx was benign in 2013   Past Surgical History  Procedure Laterality Date  . Abdominal hysterectomy    . Colonoscopy    . Polypectomy    . Foot arthrodesis, modified mcbride     Social History   Social History  . Marital Status: Married    Spouse Name: N/A  . Number of Children: N/A  . Years of Education: N/A   Social History Main Topics  . Smoking status: Never Smoker   . Smokeless tobacco: Never Used  . Alcohol Use: No  . Drug Use: No  . Sexual Activity: Not Asked   Other Topics Concern  . None   Social History Narrative   Family History  Problem Relation Age of Onset  . Breast cancer Daughter   . Hyperlipidemia Mother   . Diabetes Maternal Grandmother   . Asthma Mother    No Known Allergies Prior to Admission medications   Medication Sig Start Date End Date Taking? Authorizing Provider  rosuvastatin (CRESTOR) 20 MG tablet Take 1 tablet (20 mg total) by mouth daily. 09/09/14  Yes Josue Hector, MD  amLODipine (NORVASC) 5 MG tablet Take 1 tablet (5 mg total) by mouth daily. Patient not taking: Reported on 05/01/2015 10/07/14   Josue Hector, MD     ROS: The patient denies fevers, chills, night sweats, unintentional weight loss, palpitations,  wheezing, dyspnea on exertion, nausea, vomiting, abdominal pain, dysuria, hematuria, melena, numbness, weakness, or tingling.  All other systems have been reviewed and were otherwise negative with the exception of those mentioned in the HPI and as above.    PHYSICAL EXAM: Filed Vitals:   05/01/15 1432  BP: 138/90  Pulse: 77  Temp: 98 F (36.7 C)  Resp: 16   SpO2 Readings from Last 3 Encounters:  05/01/15 96%  10/10/14 98%  09/26/12 96%    Body mass index is 29.41 kg/(m^2).   General: Alert, no acute distress HEENT:  Normocephalic, atraumatic, oropharynx patent. EOMI, PERRLA Cardiovascular:  Regular rate and rhythm, no rubs murmurs or  gallops.  No Carotid bruits, radial pulse intact. No pedal edema.  Respiratory: Clear to auscultation bilaterally.  No wheezes, rales, or rhonchi.  No cyanosis, no use of accessory musculature Abdominal: No organomegaly, abdomen is soft and non-tender, positive bowel sounds. No masses. Skin: No rashes. Neurologic: Facial musculature symmetric. Psychiatric: Patient acts appropriately throughout our interaction. Lymphatic: No cervical or submandibular lymphadenopathy Musculoskeletal: Gait intact. No edema, tenderness   LABS: Results for orders placed or performed in visit on 05/01/15  POCT CBC  Result Value Ref Range   WBC 7.3 4.6 - 10.2 K/uL   Lymph, poc 2.5 0.6 - 3.4   POC LYMPH PERCENT 34.5 10 - 50 %L   MID (cbc) 0.2 0 - 0.9   POC MID % 3.2 0 - 12 %M   POC Granulocyte 4.5 2 - 6.9   Granulocyte percent 62.3 37 - 80 %G   RBC 5.19 4.04 - 5.48 M/uL   Hemoglobin 14.5 12.2 - 16.2 g/dL   HCT, POC 44.0 37.7 - 47.9 %   MCV 84.7 80 - 97 fL   MCH, POC 28.0 27 - 31.2 pg   MCHC 33.0 31.8 - 35.4 g/dL   RDW, POC 14.7 %   Platelet Count, POC 255 142 - 424 K/uL   MPV 7.3 0 - 99.8 fL  POCT urinalysis dipstick  Result Value Ref Range   Color, UA yellow yellow   Clarity, UA clear clear   Glucose, UA negative negative   Bilirubin, UA negative negative   Ketones, POC UA negative negative   Spec Grav, UA 1.020    Blood, UA negative negative   pH, UA 5.5    Protein Ur, POC negative negative   Urobilinogen, UA 0.2    Nitrite, UA Negative Negative   Leukocytes, UA Negative Negative  POCT Microscopic Urinalysis ([facility])  Result Value Ref Range   WBC,UR,HPF,POC Few (A) None WBC/hpf   RBC,UR,HPF,POC None None RBC/hpf   Bacteria None None   Mucus Absent Absent   Epithelial Cells, UR Per Microscopy Few (A) None cells/hpf  POCT Wet + KOH Prep ([facility])  Result Value Ref Range   Yeast by KOH Absent Present, Absent   Yeast by wet prep Absent Present, Absent   WBC by wet prep Moderate (A) None, Few     Clue Cells Wet Prep HPF POC None None   Trich by wet prep Absent Present, Absent   Bacteria Wet Prep HPF POC Few None, Few   Epithelial Cells By [employer] Pref ([facility]) Few None, Few   RBC,UR,HPF,POC None None RBC/hpf  POCT glucose (manual entry)  Result Value Ref Range   POC Glucose 66 (A) 70 - 99 mg/dl     EKG/XRAY:   Primary read interpreted by Dr. Marin Comment at [facility]. EKG does not show any ST elevation or  ischemia   ASSESSMENT/PLAN: Encounter Diagnoses  Name Primary?  . Vaginal discharge Yes  . Dysuria   . Essential hypertension   . Hyperlipidemia   . Chest pain, unspecified chest pain type    EKG is reassuring, she mayhave somechostochondral pain due to recent heavy housework, also she is anxious/dperessed with upcoming anniversay of husband's death. She declines SI/HI/hallucinations or rx meds for this.  Push fluids, take azo prn  Labs pending Refill albutrol Advise to take norvasc since has been noncompliant with meds.   Gross sideeffects, risk and benefits, and alternatives of medications d/w patient. Patient is aware that all medications have potential sideeffects and we are unable to predict every sideeffect or drug-drug interaction that may occur.    DO  05/01/2015 4:15 PM

## 2015-07-31 ENCOUNTER — Ambulatory Visit (INDEPENDENT_AMBULATORY_CARE_PROVIDER_SITE_OTHER): Payer: Commercial Managed Care - HMO

## 2015-07-31 ENCOUNTER — Ambulatory Visit (INDEPENDENT_AMBULATORY_CARE_PROVIDER_SITE_OTHER): Payer: Commercial Managed Care - HMO | Admitting: Urgent Care

## 2015-07-31 VITALS — BP 136/90 | HR 93 | Temp 98.4°F | Resp 20 | Ht 63.0 in | Wt 167.4 lb

## 2015-07-31 DIAGNOSIS — R8290 Unspecified abnormal findings in urine: Secondary | ICD-10-CM | POA: Diagnosis not present

## 2015-07-31 DIAGNOSIS — R202 Paresthesia of skin: Secondary | ICD-10-CM

## 2015-07-31 DIAGNOSIS — R209 Unspecified disturbances of skin sensation: Secondary | ICD-10-CM | POA: Diagnosis not present

## 2015-07-31 DIAGNOSIS — M545 Low back pain: Secondary | ICD-10-CM

## 2015-07-31 DIAGNOSIS — M25532 Pain in left wrist: Secondary | ICD-10-CM | POA: Diagnosis not present

## 2015-07-31 DIAGNOSIS — K5900 Constipation, unspecified: Secondary | ICD-10-CM

## 2015-07-31 DIAGNOSIS — M25531 Pain in right wrist: Secondary | ICD-10-CM | POA: Diagnosis not present

## 2015-07-31 DIAGNOSIS — R200 Anesthesia of skin: Secondary | ICD-10-CM

## 2015-07-31 DIAGNOSIS — R531 Weakness: Secondary | ICD-10-CM | POA: Diagnosis not present

## 2015-07-31 LAB — POCT CBC
Granulocyte percent: 72.1 %G (ref 37–80)
HCT, POC: 43.8 % (ref 37.7–47.9)
HEMOGLOBIN: 14.5 g/dL (ref 12.2–16.2)
LYMPH, POC: 2 (ref 0.6–3.4)
MCH, POC: 28.1 pg (ref 27–31.2)
MCHC: 33.1 g/dL (ref 31.8–35.4)
MCV: 84.8 fL (ref 80–97)
MID (cbc): 0.1 (ref 0–0.9)
MPV: 7.1 fL (ref 0–99.8)
PLATELET COUNT, POC: 248 10*3/uL (ref 142–424)
POC Granulocyte: 5.4 (ref 2–6.9)
POC LYMPH %: 26.4 % (ref 10–50)
POC MID %: 1.5 %M (ref 0–12)
RBC: 5.17 M/uL (ref 4.04–5.48)
RDW, POC: 15.1 %
WBC: 7.5 10*3/uL (ref 4.6–10.2)

## 2015-07-31 LAB — POCT URINALYSIS DIP (MANUAL ENTRY)
Bilirubin, UA: NEGATIVE
Blood, UA: NEGATIVE
Glucose, UA: NEGATIVE
Ketones, POC UA: NEGATIVE
Nitrite, UA: NEGATIVE
PH UA: 6.5
PROTEIN UA: NEGATIVE
Spec Grav, UA: 1.015
UROBILINOGEN UA: 0.2

## 2015-07-31 LAB — COMPREHENSIVE METABOLIC PANEL
ALK PHOS: 143 U/L — AB (ref 33–130)
ALT: 19 U/L (ref 6–29)
AST: 20 U/L (ref 10–35)
Albumin: 4.4 g/dL (ref 3.6–5.1)
BILIRUBIN TOTAL: 0.6 mg/dL (ref 0.2–1.2)
BUN: 16 mg/dL (ref 7–25)
CHLORIDE: 100 mmol/L (ref 98–110)
CO2: 28 mmol/L (ref 20–31)
CREATININE: 0.79 mg/dL (ref 0.50–0.99)
Calcium: 9.9 mg/dL (ref 8.6–10.4)
Glucose, Bld: 90 mg/dL (ref 65–99)
Potassium: 4.5 mmol/L (ref 3.5–5.3)
SODIUM: 139 mmol/L (ref 135–146)
TOTAL PROTEIN: 7 g/dL (ref 6.1–8.1)

## 2015-07-31 LAB — POC MICROSCOPIC URINALYSIS (UMFC): MUCUS RE: ABSENT

## 2015-07-31 LAB — TSH: TSH: 0.768 u[IU]/mL (ref 0.350–4.500)

## 2015-07-31 MED ORDER — MELOXICAM 7.5 MG PO TABS: 7.5000 mg | ORAL_TABLET | Freq: Every day | ORAL | Status: DC

## 2015-07-31 MED ORDER — POLYETHYLENE GLYCOL 3350 17 GM/SCOOP PO POWD: 17.0000 g | Freq: Every day | ORAL | Status: DC | PRN

## 2015-07-31 MED ORDER — CYCLOBENZAPRINE HCL 5 MG PO TABS: 5.0000 mg | ORAL_TABLET | Freq: Every day | ORAL | Status: DC

## 2015-07-31 NOTE — Progress Notes (Signed)
MRN: [id number] DOB: [birth year]  Subjective:   Pamela Small is a 70 y.o. female presenting for chief complaint of Back Pain and Urinary Tract Infection  Reports 3 week history of limb numbness and weakness, shakiness, intermittent burning sensation over bottom of feet. She has also had some low back pain when she reaches above her head, urinary urgency, urinary frequency, malodorous urine. Patient tries to hydrate well, denies history of diabetes. Patient has a history of constipation, eats very unhealthily and does not exercise. Of note, patient is recently widowed and her husband died unexpectedly. She admits that when she thinks about this, she does get sad but generally has felt "okay". Her husband past away in the past year. Her son also tries to check on her. She denies feeling depressed, guilt, slowed speech, difficulty with concentration, irritability. Her cardiologist saw her in 03/2015, had normal cardiac work up, cardiologist recommended SSRI therapy for depression. Family history is positive for dementia.   Pamela Small has a current medication list which includes the following prescription(s): albuterol, amlodipine, and lovastatin. Also has No Known Allergies.  [name]  has a past medical history of Hyperlipidemia; Asthma; Lung nodules; Hypertension; and Multiple thyroid nodules. Also  has past surgical history that includes Abdominal hysterectomy; Colonoscopy; Polypectomy; and Foot arthrodesis, modified Mcbride.  Objective:   Vitals: BP 136/90 mmHg  Pulse 93  Temp(Src) 98.4 F (36.9 C) (Oral)  Resp 20  Ht 5\' 3"  (1.6 m)  Wt 167 lb 6.4 oz (75.932 kg)  BMI 29.66 kg/m2  SpO2 97%  Wt Readings from Last 3 Encounters:  07/31/15 167 lb 6.4 oz (75.932 kg)  05/01/15 166 lb (75.297 kg)  03/19/15 166 lb (75.297 kg)   Physical Exam  Constitutional: She is oriented to person, place, and time. She appears well-developed and well-nourished.  HENT:  Mouth/Throat: Oropharynx is clear  and moist.  Eyes: Right eye exhibits no discharge. Left eye exhibits no discharge. No scleral icterus.  Neck: Normal range of motion. Neck supple. No thyromegaly present.  Cardiovascular: Normal rate, regular rhythm and intact distal pulses.  Exam reveals no gallop and no friction rub.   No murmur heard. Pulmonary/Chest: No respiratory distress. She has no wheezes. She has no rales.  Abdominal: Soft. Bowel sounds are normal. She exhibits no distension and no mass. There is no tenderness.  Musculoskeletal: She exhibits no edema.  Neurological: She is alert and oriented to person, place, and time.  Skin: Skin is warm and dry. No rash noted. No erythema. No pallor.   Results for orders placed or performed in visit on 07/31/15 (from the past 24 hour(s))  POCT urinalysis dipstick     Status: Abnormal   Collection Time: 07/31/15 11:27 AM  Result Value Ref Range   Color, UA yellow yellow   Clarity, UA clear clear   Glucose, UA negative negative   Bilirubin, UA negative negative   Ketones, POC UA negative negative   Spec Grav, UA 1.015    Blood, UA negative negative   pH, UA 6.5    Protein Ur, POC negative negative   Urobilinogen, UA 0.2    Nitrite, UA Negative Negative   Leukocytes, UA Trace (A) Negative  POCT Microscopic Urinalysis ([facility])     Status: Abnormal   Collection Time: 07/31/15 11:27 AM  Result Value Ref Range   WBC,UR,HPF,POC None None WBC/hpf   RBC,UR,HPF,POC None None RBC/hpf   Bacteria None None, Too numerous to count   Mucus Absent Absent  Epithelial Cells, UR Per Microscopy Few (A) None, Too numerous to count cells/hpf  POCT CBC     Status: None   Collection Time: 07/31/15 11:27 AM  Result Value Ref Range   WBC 7.5 4.6 - 10.2 K/uL   Lymph, poc 2.0 0.6 - 3.4   POC LYMPH PERCENT 26.4 10 - 50 %L   MID (cbc) 0.1 0 - 0.9   POC MID % 1.5 0 - 12 %M   POC Granulocyte 5.4 2 - 6.9   Granulocyte percent 72.1 37 - 80 %G   RBC 5.17 4.04 - 5.48 M/uL   Hemoglobin 14.5 12.2 -  16.2 g/dL   HCT, POC 43.8 37.7 - 47.9 %   MCV 84.8 80 - 97 fL   MCH, POC 28.1 27 - 31.2 pg   MCHC 33.1 31.8 - 35.4 g/dL   RDW, POC 15.1 %   Platelet Count, POC 248 142 - 424 K/uL   MPV 7.1 0 - 99.8 fL   [facility] reading (PRIMARY) by  Dr. Everlene Farrier and PA-Tiah Heckel. Lumbar - Degenerative changes, space narrowing at L5-S1.  Dg Lumbar Spine Complete  07/31/2015  CLINICAL DATA:  Back pain.  Initial evaluation. EXAM: LUMBAR SPINE - COMPLETE 4+ VIEW COMPARISON:  No prior . FINDINGS: Diffuse degenerative change. No acute abnormality. No evidence of fracture. Calcifications noted over the left flank. These could be vascular calcifications. Left ureteral stones cannot be excluded. Aortoiliac atherosclerotic vascular disease. IMPRESSION: 1. Multiple small calcifications over the left flank. These could be vascular calcifications. Left renal stones cannot be excluded. 2. Diffuse degenerative change lumbar spine.  No acute abnormality . Electronically Signed   By: Marcello Moores  Register   On: 07/31/2015 12:48   Assessment and Plan :   1. Malodorous urine 2. Numbness and tingling 3. Weakness - Suspect that multiple symptoms from different systems may be more of a manifestation of depression. Patient was very tearful when talking about this but has been opposed to starting SSRI or talking with therapist. She states that she will consider this and let me know what she decides. I recommended she recheck with Dr. Marin Comment, her PCP. Patient agreed.  4. Pain in both wrists - At the end of her visit, patient reports numbness, tingling and wrist pain mostly when she drives long distances to visit family in [place] and [place]. Counseled her on carpal tunnel syndrome, see #5 which may help this as well, wear wrist splints at night or while driving. Patient agreed.  5. Low back pain without sciatica, unspecified back pain laterality - Suspect arthritis, will start meloxicam and Flexeril. Consider steroid course and/or referral to PT or  ortho if no improvement.  6. Constipation, unspecified constipation type - Counseled on dietary modifications and use or polyethylene glycol PRN. Patient agreed. Also recommended light exercise to help with constipation, depression and her back.  [name], PA-C [facility] [phone] 07/31/2015 10:58 AM

## 2015-07-31 NOTE — Patient Instructions (Addendum)
Escitalopram tablets What is this medicine? ESCITALOPRAM (es sye TAL oh pram) is used to treat depression and certain types of anxiety. This medicine may be used for other purposes; ask your health care provider or pharmacist if you have questions. What should I tell my health care provider before I take this medicine? They need to know if you have any of these conditions: -bipolar disorder or a family history of bipolar disorder -diabetes -glaucoma -heart disease -kidney or liver disease -receiving electroconvulsive therapy -seizures (convulsions) -suicidal thoughts, plans, or attempt by you or a family member -an unusual or allergic reaction to escitalopram, the related drug citalopram, other medicines, foods, dyes, or preservatives -pregnant or trying to become pregnant -breast-feeding How should I use this medicine? Take this medicine by mouth with a glass of water. Follow the directions on the prescription label. You can take it with or without food. If it upsets your stomach, take it with food. Take your medicine at regular intervals. Do not take it more often than directed. Do not stop taking this medicine suddenly except upon the advice of your doctor. Stopping this medicine too quickly may cause serious side effects or your condition may worsen. A special MedGuide will be given to you by the pharmacist with each prescription and refill. Be sure to read this information carefully each time. Talk to your pediatrician regarding the use of this medicine in children. Special care may be needed. Overdosage: If you think you have taken too much of this medicine contact a poison control center or emergency room at once. NOTE: This medicine is only for you. Do not share this medicine with others. What if I miss a dose? If you miss a dose, take it as soon as you can. If it is almost time for your next dose, take only that dose. Do not take double or extra doses. What may interact with this  medicine? Do not take this medicine with any of the following medications: -certain medicines for fungal infections like fluconazole, itraconazole, ketoconazole, posaconazole, voriconazole -cisapride -citalopram -dofetilide -dronedarone -linezolid -MAOIs like Carbex, Eldepryl, Marplan, Nardil, and Parnate -methylene blue (injected into a vein) -pimozide -thioridazine -ziprasidone This medicine may also interact with the following medications: -alcohol -aspirin and aspirin-like medicines -carbamazepine -certain medicines for depression, anxiety, or psychotic disturbances -certain medicines for migraine headache like almotriptan, eletriptan, frovatriptan, naratriptan, rizatriptan, sumatriptan, zolmitriptan -certain medicines for sleep -certain medicines that treat or prevent blood clots like warfarin, enoxaparin, dalteparin -cimetidine -diuretics -fentanyl -furazolidone -isoniazid -lithium -metoprolol -NSAIDs, medicines for pain and inflammation, like ibuprofen or naproxen -other medicines that prolong the QT interval (cause an abnormal heart rhythm) -procarbazine -rasagiline -supplements like St. John's wort, kava kava, valerian -tramadol -tryptophan This list may not describe all possible interactions. Give your health care provider a list of all the medicines, herbs, non-prescription drugs, or dietary supplements you use. Also tell them if you smoke, drink alcohol, or use illegal drugs. Some items may interact with your medicine. What should I watch for while using this medicine? Tell your doctor if your symptoms do not get better or if they get worse. Visit your doctor or health care professional for regular checks on your progress. Because it may take several weeks to see the full effects of this medicine, it is important to continue your treatment as prescribed by your doctor. Patients and their families should watch out for new or worsening thoughts of suicide or  depression. Also watch out for sudden changes in feelings such  as feeling anxious, agitated, panicky, irritable, hostile, aggressive, impulsive, severely restless, overly excited and hyperactive, or not being able to sleep. If this happens, especially at the beginning of treatment or after a change in dose, call your health care professional. Dennis Bast may get drowsy or dizzy. Do not drive, use machinery, or do anything that needs mental alertness until you know how this medicine affects you. Do not stand or sit up quickly, especially if you are an older patient. This reduces the risk of dizzy or fainting spells. Alcohol may interfere with the effect of this medicine. Avoid alcoholic drinks. Your mouth may get dry. Chewing sugarless gum or sucking hard candy, and drinking plenty of water may help. Contact your doctor if the problem does not go away or is severe. What side effects may I notice from receiving this medicine? Side effects that you should report to your doctor or health care professional as soon as possible: -allergic reactions like skin rash, itching or hives, swelling of the face, lips, or tongue -confusion -feeling faint or lightheaded, falls -fast talking and excited feelings or actions that are out of control -hallucination, loss of contact with reality -seizures -suicidal thoughts or other mood changes -unusual bleeding or bruising Side effects that usually do not require medical attention (report to your doctor or health care professional if they continue or are bothersome): -blurred vision -changes in appetite -change in sex drive or performance -headache -increased sweating -nausea This list may not describe all possible side effects. Call your doctor for medical advice about side effects. You may report side effects to FDA at 1-800-FDA-1088. Where should I keep my medicine? Keep out of reach of children. Store at room temperature between 15 and 30 degrees C (59 and 86 degrees  F). Throw away any unused medicine after the expiration date. NOTE: This sheet is a summary. It may not cover all possible information. If you have questions about this medicine, talk to your doctor, pharmacist, or health care provider.    2016, Elsevier/Gold Standard. (2013-01-29 12:32:55)    Carpal Tunnel Syndrome Carpal tunnel syndrome is a condition that causes pain in your hand and arm. The carpal tunnel is a narrow area located on the palm side of your wrist. Repeated wrist motion or certain diseases may cause swelling within the tunnel. This swelling pinches the main nerve in the wrist (median nerve). CAUSES  This condition may be caused by:   Repeated wrist motions.  Wrist injuries.  Arthritis.  A cyst or tumor in the carpal tunnel.  Fluid buildup during pregnancy. Sometimes the cause of this condition is not known.  RISK FACTORS This condition is more likely to develop in:   People who have jobs that cause them to repeatedly move their wrists in the same motion, such as butchers and cashiers.  Women.  People with certain conditions, such as:  Diabetes.  Obesity.  An underactive thyroid (hypothyroidism).  Kidney failure. SYMPTOMS  Symptoms of this condition include:   A tingling feeling in your fingers, especially in your thumb, index, and middle fingers.  Tingling or numbness in your hand.  An aching feeling in your entire arm, especially when your wrist and elbow are bent for long periods of time.  Wrist pain that goes up your arm to your shoulder.  Pain that goes down into your palm or fingers.  A weak feeling in your hands. You may have trouble grabbing and holding items. Your symptoms may feel worse during the night.  DIAGNOSIS  This condition is diagnosed with a medical history and physical exam. You may also have tests, including:   An electromyogram (EMG). This test measures electrical signals sent by your nerves into the  muscles.  X-rays. TREATMENT  Treatment for this condition includes:  Lifestyle changes. It is important to stop doing or modify the activity that caused your condition.  Physical or occupational therapy.  Medicines for pain and inflammation. This may include medicine that is injected into your wrist.  A wrist splint.  Surgery. HOME CARE INSTRUCTIONS  If You Have a Splint:  Wear it as told by your health care provider. Remove it only as told by your health care provider.  Loosen the splint if your fingers become numb and tingle, or if they turn cold and blue.  Keep the splint clean and dry. General Instructions  Take over-the-counter and prescription medicines only as told by your health care provider.  Rest your wrist from any activity that may be causing your pain. If your condition is work related, talk to your employer about changes that can be made, such as getting a wrist pad to use while typing.  If directed, apply ice to the painful area:  Put ice in a plastic bag.  Place a towel between your skin and the bag.  Leave the ice on for 20 minutes, 2-3 times per day.  Keep all follow-up visits as told by your health care provider. This is important.  Do any exercises as told by your health care provider, physical therapist, or occupational therapist. [place] IF:   You have new symptoms.  Your pain is not controlled with medicines.  Your symptoms get worse.   This information is not intended to replace advice given to you by your health care provider. Make sure you discuss any questions you have with your health care provider.   Document Released: 07/01/2000 Document Revised: 03/25/2015 Document Reviewed: 11/19/2014 Elsevier Interactive Patient Education 2016 [employer].    Constipation, Adult Constipation is when a person has fewer than three bowel movements a week, has difficulty having a bowel movement, or has stools that are dry, hard, or  larger than normal. As people grow older, constipation is more common. A low-fiber diet, not taking in enough fluids, and taking certain medicines may make constipation worse.  CAUSES   Certain medicines, such as antidepressants, pain medicine, iron supplements, antacids, and water pills.   Certain diseases, such as diabetes, irritable bowel syndrome (IBS), thyroid disease, or depression.   Not drinking enough water.   Not eating enough fiber-rich foods.   Stress or travel.   Lack of physical activity or exercise.   Ignoring the urge to have a bowel movement.   Using laxatives too much.  SIGNS AND SYMPTOMS   Having fewer than three bowel movements a week.   Straining to have a bowel movement.   Having stools that are hard, dry, or larger than normal.   Feeling full or bloated.   Pain in the lower abdomen.   Not feeling relief after having a bowel movement.  DIAGNOSIS  Your health care provider will take a medical history and perform a physical exam. Further testing may be done for severe constipation. Some tests may include:  A barium enema X-ray to examine your rectum, colon, and, sometimes, your small intestine.   A sigmoidoscopy to examine your lower colon.   A colonoscopy to examine your entire colon. TREATMENT  Treatment will depend on the  severity of your constipation and what is causing it. Some dietary treatments include drinking more fluids and eating more fiber-rich foods. Lifestyle treatments may include regular exercise. If these diet and lifestyle recommendations do not help, your health care provider may recommend taking over-the-counter laxative medicines to help you have bowel movements. Prescription medicines may be prescribed if over-the-counter medicines do not work.  HOME CARE INSTRUCTIONS   Eat foods that have a lot of fiber, such as fruits, vegetables, whole grains, and beans.  Limit foods high in fat and processed sugars, such as  french fries, hamburgers, cookies, candies, and soda.   A fiber supplement may be added to your diet if you cannot get enough fiber from foods.   Drink enough fluids to keep your urine clear or pale yellow.   Exercise regularly or as directed by your health care provider.   Go to the restroom when you have the urge to go. Do not hold it.   Only take over-the-counter or prescription medicines as directed by your health care provider. Do not take other medicines for constipation without talking to your health care provider first.  [place] IF:   You have bright red blood in your stool.   Your constipation lasts for more than 4 days or gets worse.   You have abdominal or rectal pain.   You have thin, pencil-like stools.   You have unexplained weight loss. MAKE SURE YOU:   Understand these instructions.  Will watch your condition.  Will get help right away if you are not doing well or get worse.   This information is not intended to replace advice given to you by your health care provider. Make sure you discuss any questions you have with your health care provider.   Document Released: 04/01/2004 Document Revised: 07/25/2014 Document Reviewed: 04/15/2013 Elsevier Interactive Patient Education [employer].

## 2015-08-04 ENCOUNTER — Encounter: Payer: Self-pay | Admitting: Urgent Care

## 2015-10-07 DIAGNOSIS — Z1231 Encounter for screening mammogram for malignant neoplasm of breast: Secondary | ICD-10-CM | POA: Diagnosis not present

## 2015-10-07 LAB — HM MAMMOGRAPHY

## 2015-10-14 ENCOUNTER — Encounter: Payer: Self-pay | Admitting: Family Medicine

## 2015-12-19 ENCOUNTER — Other Ambulatory Visit: Payer: Self-pay | Admitting: Cardiovascular Disease

## 2015-12-31 ENCOUNTER — Ambulatory Visit (INDEPENDENT_AMBULATORY_CARE_PROVIDER_SITE_OTHER): Payer: Commercial Managed Care - HMO | Admitting: Urgent Care

## 2015-12-31 ENCOUNTER — Other Ambulatory Visit: Payer: Self-pay | Admitting: Urgent Care

## 2015-12-31 VITALS — BP 122/68 | HR 88 | Temp 97.6°F | Resp 16 | Ht 63.0 in | Wt 168.0 lb

## 2015-12-31 DIAGNOSIS — R202 Paresthesia of skin: Secondary | ICD-10-CM | POA: Diagnosis not present

## 2015-12-31 DIAGNOSIS — E669 Obesity, unspecified: Secondary | ICD-10-CM

## 2015-12-31 DIAGNOSIS — R208 Other disturbances of skin sensation: Secondary | ICD-10-CM

## 2015-12-31 DIAGNOSIS — R209 Unspecified disturbances of skin sensation: Secondary | ICD-10-CM | POA: Diagnosis not present

## 2015-12-31 DIAGNOSIS — R7303 Prediabetes: Secondary | ICD-10-CM | POA: Diagnosis not present

## 2015-12-31 DIAGNOSIS — R200 Anesthesia of skin: Secondary | ICD-10-CM

## 2015-12-31 LAB — POCT CBC
GRANULOCYTE PERCENT: 61.2 % (ref 37–80)
HEMATOCRIT: 42.2 % (ref 37.7–47.9)
HEMOGLOBIN: 14.6 g/dL (ref 12.2–16.2)
Lymph, poc: 2.1 (ref 0.6–3.4)
MCH, POC: 28.8 pg (ref 27–31.2)
MCHC: 34.5 g/dL (ref 31.8–35.4)
MCV: 83.6 fL (ref 80–97)
MID (cbc): 0.5 (ref 0–0.9)
MPV: 7.2 fL (ref 0–99.8)
POC GRANULOCYTE: 4.2 (ref 2–6.9)
POC LYMPH PERCENT: 31.3 %L (ref 10–50)
POC MID %: 7.5 %M (ref 0–12)
Platelet Count, POC: 263 10*3/uL (ref 142–424)
RBC: 5.05 M/uL (ref 4.04–5.48)
RDW, POC: 14 %
WBC: 6.8 10*3/uL (ref 4.6–10.2)

## 2015-12-31 LAB — POCT GLYCOSYLATED HEMOGLOBIN (HGB A1C): Hemoglobin A1C: 6.2

## 2015-12-31 MED ORDER — PREGABALIN 50 MG PO CAPS: 50.0000 mg | ORAL_CAPSULE | Freq: Three times a day (TID) | ORAL | Status: DC

## 2015-12-31 MED ORDER — METFORMIN HCL 500 MG PO TABS: 500.0000 mg | ORAL_TABLET | Freq: Two times a day (BID) | ORAL | Status: DC

## 2015-12-31 NOTE — Patient Instructions (Addendum)
Diabetes Mellitus and Food It is important for you to manage your blood sugar (glucose) level. Your blood glucose level can be greatly affected by what you eat. Eating healthier foods in the appropriate amounts throughout the day at about the same time each day will help you control your blood glucose level. It can also help slow or prevent worsening of your diabetes mellitus. Healthy eating may even help you improve the level of your blood pressure and reach or maintain a healthy weight.  General recommendations for healthful eating and cooking habits include:  Eating meals and snacks regularly. Avoid going long periods of time without eating to lose weight.  Eating a diet that consists mainly of plant-based foods, such as fruits, vegetables, nuts, legumes, and whole grains.  Using low-heat cooking methods, such as baking, instead of high-heat cooking methods, such as deep frying. Work with your dietitian to make sure you understand how to use the Nutrition Facts information on food labels. HOW CAN FOOD AFFECT ME? Carbohydrates Carbohydrates affect your blood glucose level more than any other type of food. Your dietitian will help you determine how many carbohydrates to eat at each meal and teach you how to count carbohydrates. Counting carbohydrates is important to keep your blood glucose at a healthy level, especially if you are using insulin or taking certain medicines for diabetes mellitus. Alcohol Alcohol can cause sudden decreases in blood glucose (hypoglycemia), especially if you use insulin or take certain medicines for diabetes mellitus. Hypoglycemia can be a life-threatening condition. Symptoms of hypoglycemia (sleepiness, dizziness, and disorientation) are similar to symptoms of having too much alcohol.  If your health care provider has given you approval to drink alcohol, do so in moderation and use the following guidelines:  Women should not have more than one drink per day, and men  should not have more than two drinks per day. One drink is equal to:  12 oz of beer.  5 oz of wine.  1 oz of hard liquor.  Do not drink on an empty stomach.  Keep yourself hydrated. Have water, diet soda, or unsweetened iced tea.  Regular soda, juice, and other mixers might contain a lot of carbohydrates and should be counted. WHAT FOODS ARE NOT RECOMMENDED? As you make food choices, it is important to remember that all foods are not the same. Some foods have fewer nutrients per serving than other foods, even though they might have the same number of calories or carbohydrates. It is difficult to get your body what it needs when you eat foods with fewer nutrients. Examples of foods that you should avoid that are high in calories and carbohydrates but low in nutrients include:  Trans fats (most processed foods list trans fats on the Nutrition Facts label).  Regular soda.  Juice.  Candy.  Sweets, such as cake, pie, doughnuts, and cookies.  Fried foods. WHAT FOODS CAN I EAT? Eat nutrient-rich foods, which will nourish your body and keep you healthy. The food you should eat also will depend on several factors, including:  The calories you need.  The medicines you take.  Your weight.  Your blood glucose level.  Your blood pressure level.  Your cholesterol level. You should eat a variety of foods, including:  Protein.  Lean cuts of meat.  Proteins low in saturated fats, such as fish, egg whites, and beans. Avoid processed meats.  Fruits and vegetables.  Fruits and vegetables that may help control blood glucose levels, such as apples, mangoes, and   yams.  Dairy products.  Choose fat-free or low-fat dairy products, such as milk, yogurt, and cheese.  Grains, bread, pasta, and rice.  Choose whole grain products, such as multigrain bread, whole oats, and brown rice. These foods may help control blood pressure.  Fats.  Foods containing healthful fats, such as nuts,  avocado, olive oil, canola oil, and fish. DOES EVERYONE WITH DIABETES MELLITUS HAVE THE SAME MEAL PLAN? Because every person with diabetes mellitus is different, there is not one meal plan that works for everyone. It is very important that you meet with a dietitian who will help you create a meal plan that is just right for you.   This information is not intended to replace advice given to you by your health care provider. Make sure you discuss any questions you have with your health care provider.   Document Released: 03/31/2005 Document Revised: 07/25/2014 Document Reviewed: 05/31/2013 Elsevier Interactive Patient Education 2016 [place] for Eating Away From Home If You Have Diabetes Controlling your level of blood glucose, also known as blood sugar, can be challenging. It can be even more difficult when you do not prepare your own meals. The following tips can help you manage your diabetes when you eat away from home. PLANNING AHEAD Plan ahead if you know you will be eating away from home:  Ask your health care provider how to time meals and medicine if you are taking insulin.  Make a list of restaurants near you that offer healthy choices. If they have a carry-out menu, take it home and plan what you will order ahead of time.  Look up the restaurant you want to eat at online. Many chain and fast-food restaurants list nutritional information online. Use this information to choose the healthiest options and to calculate how many carbohydrates will be in your meal.  Use a carbohydrate-counting book or mobile app to look up the carbohydrate content and serving size of the foods you want to eat.  Become familiar with serving sizes and learn to recognize how many servings are in a portion. This will allow you to estimate how many carbohydrates you can eat. FREE FOODS A "free food" is any food or drink that has less than 5 g of carbohydrates per serving. Free foods  include:  Many vegetables.  Hard boiled eggs.  Nuts or seeds.  Olives.  Cheeses.  Meats. These types of foods make good appetizer choices and are often available at salad bars. Lemon juice, vinegar, or a low-calorie salad dressing of fewer than 20 calories per serving can be used as a "free" salad dressing.  CHOICES TO REDUCE CARBOHYDRATES  Substitute nonfat sweetened yogurt with a sugar-free yogurt. Yogurt made from soy milk may also be used, but you will still want a sugar-free or plain option to choose a lower carbohydrate amount.  Ask your server to take away the bread basket or chips from your table.  Order fresh fruit. A salad bar often offers fresh fruit choices. Avoid canned fruit because it is usually packed in sugar or syrup.  Order a salad, and eat it without dressing. Or, create a "free" salad dressing.  Ask for substitutions. For example, instead of [place] fries, request an order of a vegetable such as salad, green beans, or broccoli. OTHER TIPS   If you take insulin, take the insulin once your food arrives to your table. This will ensure your insulin and food are timed correctly.  Ask your server about  the portion size before your order, and ask for a take-out box if the portion has more servings than you should have. When your food comes, leave the amount you should have on the plate, and put the rest in the take-out box.  Consider splitting an entree with someone and ordering a side salad.   This information is not intended to replace advice given to you by your health care provider. Make sure you discuss any questions you have with your health care provider.   Document Released: 07/04/2005 Document Revised: 03/25/2015 Document Reviewed: 10/01/2013 Elsevier Interactive Patient Education 2016 [employer].     IF you received an x-ray today, you will receive an invoice from [facility]. Please contact [facility] at [phone] with  questions or concerns regarding your invoice.   IF you received labwork today, you will receive an invoice from [employer]. Please contact [facility] at [phone] with questions or concerns regarding your invoice.   Our billing staff will not be able to assist you with questions regarding bills from these companies.  You will be contacted with the lab results as soon as they are available. The fastest way to get your results is to activate your My Chart account. Instructions are located on the last page of this paperwork. If you have not heard from [place] regarding the results in 2 weeks, please contact this office.

## 2015-12-31 NOTE — Progress Notes (Signed)
MRN: [id number] DOB: [birth year]  Subjective:   Pamela Small is a 70 y.o. female presenting for chief complaint of Numbness and Leg Pain  Reports several month history of intermittent numbness and tingling of hands and feet. Also has lower leg burning sensation. Back pain and constipation have improved from her last visit. In the past year, she has felt her vision become more blurry especially at night, has not had a recheck with her ophthalmologist. Denies fever, weight loss, headaches, trauma, weakness, chest pain, n/v, abdominal pain. Patient is retired, spends time with family, cleans her home, takes trips. Family history is negative for musculoskeletal disorders.   [name] has a current medication list which includes the following prescription(s): albuterol, cyclobenzaprine, lovastatin, polyethylene glycol powder, amlodipine, and meloxicam. Also has No Known Allergies.  [name]  has a past medical history of Hyperlipidemia; Asthma; Lung nodules; Hypertension; and Multiple thyroid nodules. Also  has past surgical history that includes Abdominal hysterectomy; Colonoscopy; Polypectomy; and Foot arthrodesis, modified Mcbride.  Her family history includes Asthma in her mother; Breast cancer in her daughter; Diabetes in her maternal grandmother; Hyperlipidemia in her mother.   Objective:   Vitals: Pulse 88  Temp(Src) 97.6 F (36.4 C) (Oral)  Resp 16  Ht 5\' 3"  (1.6 m)  Wt 168 lb (76.204 kg)  BMI 29.77 kg/m2  SpO2 98%  Wt Readings from Last 3 Encounters:  12/31/15 168 lb (76.204 kg)  07/31/15 167 lb 6.4 oz (75.932 kg)  05/01/15 166 lb (75.297 kg)    BP Readings from Last 3 Encounters:  12/31/15 122/68  07/31/15 136/90  05/01/15 138/90   Physical Exam  Constitutional: She is oriented to person, place, and time. She appears well-developed and well-nourished.  HENT:  Mouth/Throat: Oropharynx is clear and moist.  Eyes: Pupils are equal, round, and reactive to light. No scleral  icterus.  Neck: Normal range of motion. Neck supple. No thyromegaly present.  Cardiovascular: Normal rate, regular rhythm and intact distal pulses.  Exam reveals no gallop and no friction rub.   No murmur heard. Pulmonary/Chest: No respiratory distress. She has no wheezes. She has no rales.  Musculoskeletal: She exhibits no edema.  Neurological: She is alert and oriented to person, place, and time. She has normal reflexes. No cranial nerve deficit.  Sensation intact both hands and both feet.  Skin: Skin is warm and dry.   Results for orders placed or performed in visit on 12/31/15 (from the past 24 hour(s))  POCT CBC     Status: None   Collection Time: 12/31/15  2:58 PM  Result Value Ref Range   WBC 6.8 4.6 - 10.2 K/uL   Lymph, poc 2.1 0.6 - 3.4   POC LYMPH PERCENT 31.3 10 - 50 %L   MID (cbc) 0.5 0 - 0.9   POC MID % 7.5 0 - 12 %M   POC Granulocyte 4.2 2 - 6.9   Granulocyte percent 61.2 37 - 80 %G   RBC 5.05 4.04 - 5.48 M/uL   Hemoglobin 14.6 12.2 - 16.2 g/dL   HCT, POC 42.2 37.7 - 47.9 %   MCV 83.6 80 - 97 fL   MCH, POC 28.8 27 - 31.2 pg   MCHC 34.5 31.8 - 35.4 g/dL   RDW, POC 14.0 %   Platelet Count, POC 263 142 - 424 K/uL   MPV 7.2 0 - 99.8 fL  POCT glycosylated hemoglobin (Hb A1C)     Status: None   Collection Time: 12/31/15  3:07 PM  Result Value Ref Range   Hemoglobin A1C 6.2    Assessment and Plan :   1. Pre-diabetes 2. Numbness and tingling 3. Burning sensation - Start Metformin, advised dietary modifications. Start Lyrica for numbness and tingling. Recheck in 3 months.  [name], PA-C [facility] [phone] 12/31/2015 2:32 PM

## 2016-01-01 ENCOUNTER — Encounter: Payer: Self-pay | Admitting: Urgent Care

## 2016-01-01 LAB — COMPREHENSIVE METABOLIC PANEL
ALK PHOS: 153 U/L — AB (ref 33–130)
ALT: 13 U/L (ref 6–29)
AST: 16 U/L (ref 10–35)
Albumin: 4.3 g/dL (ref 3.6–5.1)
BUN: 19 mg/dL (ref 7–25)
CHLORIDE: 105 mmol/L (ref 98–110)
CO2: 24 mmol/L (ref 20–31)
CREATININE: 0.79 mg/dL (ref 0.60–0.93)
Calcium: 9.5 mg/dL (ref 8.6–10.4)
GLUCOSE: 80 mg/dL (ref 65–99)
POTASSIUM: 4 mmol/L (ref 3.5–5.3)
SODIUM: 139 mmol/L (ref 135–146)
TOTAL PROTEIN: 7.2 g/dL (ref 6.1–8.1)
Total Bilirubin: 0.4 mg/dL (ref 0.2–1.2)

## 2016-01-24 ENCOUNTER — Other Ambulatory Visit: Payer: Self-pay | Admitting: Family Medicine

## 2016-03-23 ENCOUNTER — Ambulatory Visit (INDEPENDENT_AMBULATORY_CARE_PROVIDER_SITE_OTHER): Payer: Commercial Managed Care - HMO | Admitting: Physician Assistant

## 2016-03-23 ENCOUNTER — Encounter: Payer: Self-pay | Admitting: Physician Assistant

## 2016-03-23 VITALS — BP 136/84 | HR 89 | Temp 98.1°F | Resp 17 | Ht 63.0 in | Wt 165.0 lb

## 2016-03-23 DIAGNOSIS — G44219 Episodic tension-type headache, not intractable: Secondary | ICD-10-CM | POA: Diagnosis not present

## 2016-03-23 NOTE — Patient Instructions (Addendum)
Take 1000 mg of Tylenol every 8 hours as needed for headache.  Take you flexeril as prescribed as well.  Take meloxicam only as needed.  If you are not better in three weeks then I will send you to neurology in about 3 weeks.   I am glad you are losing weight.  This is good for your prediabetes.  Please continue to avoid fried foods and sugary drnkis.    IF you received an x-ray today, you will receive an invoice from [facility]. Please contact [facility] at [phone] with questions or concerns regarding your invoice.   IF you received labwork today, you will receive an invoice from [employer]. Please contact [facility] at [phone] with questions or concerns regarding your invoice.   Our billing staff will not be able to assist you with questions regarding bills from these companies.  You will be contacted with the lab results as soon as they are available. The fastest way to get your results is to activate your My Chart account. Instructions are located on the last page of this paperwork. If you have not heard from [place] regarding the results in 2 weeks, please contact this office.

## 2016-03-23 NOTE — Progress Notes (Signed)
03/23/2016 3:47 PM   DOB: 1946-03-01 / MRN: [id number]  SUBJECTIVE:  Pamela Small is a 70 y.o. female presenting for bilateral occiput HA that is moderate to severe, and dull in nature.  Having HA about 2-3 days per week now for about 1 month. No weakness, vision changes, confusion, changes in dexterity, head trauma.  Is concerned because she losing weight and has lost about 10 lbs in the last month.  Was told she was a pre diabetic about 2 months ago and stopped eating fried foods at that time.  Stopped drinking soda about 1 month ago because she could not tolerate side effects of metformin.  She is caring for two of her sisters at this time, one of which requires assistance with ADLs, since July. Gets good relief with Meloxicam and flexeril.   Depression screen PHQ 2/9 03/23/2016  Decreased Interest 0  Down, Depressed, Hopeless 0  PHQ - 2 Score 0     She has No Known Allergies.   She  has a past medical history of Asthma; Hyperlipidemia; Hypertension; Lung nodules; and Multiple thyroid nodules.    She  reports that she has never smoked. She has never used smokeless tobacco. She reports that she does not drink alcohol or use drugs. She  reports that she does not engage in sexual activity. The patient  has a past surgical history that includes Abdominal hysterectomy; Colonoscopy; Polypectomy; and Foot arthrodesis, modified Mcbride.  Her family history includes Asthma in her mother; Breast cancer in her daughter; Diabetes in her maternal grandmother; Hyperlipidemia in her mother.  Review of Systems  Constitutional: Negative for chills and fever.  HENT: Negative for congestion.   Respiratory: Negative for cough.   Skin: Negative for rash.  Neurological: Negative for dizziness, tingling, tremors, sensory change, speech change, focal weakness, seizures and loss of consciousness.    The problem list and medications were reviewed and updated by myself where necessary and exist elsewhere in  the encounter.   OBJECTIVE:  BP 136/84 (BP Location: Right Arm, Patient Position: Sitting, Cuff Size: Normal)   Pulse 89   Temp 98.1 F (36.7 C) (Oral)   Resp 17   Ht 5\' 3"  (1.6 m)   Wt 165 lb (74.8 kg)   SpO2 97%   BMI 29.23 kg/m   Physical Exam  Constitutional: She is oriented to person, place, and time. She appears well-developed and well-nourished. No distress.  HENT:  Mouth/Throat: Oropharynx is clear and moist. No oropharyngeal exudate.  Cardiovascular: Normal rate, regular rhythm and normal heart sounds.   Pulmonary/Chest: Effort normal and breath sounds normal.  Musculoskeletal: Normal range of motion.  Neurological: She is alert and oriented to person, place, and time. She displays normal reflexes. No cranial nerve deficit. She exhibits normal muscle tone. Coordination normal.  Skin: Skin is warm and dry. She is not diaphoretic.  Psychiatric: She has a normal mood and affect. Her behavior is normal. Judgment and thought content normal.    Lab Results  Component Value Date   HGBA1C 6.2 12/31/2015   Lab Results  Component Value Date   TSH 0.768 07/31/2015   Lab Results  Component Value Date   CREATININE 0.79 12/31/2015   Lab Results  Component Value Date   K 4.0 12/31/2015        No results found for this or any previous visit (from the past 72 hour(s)).  No results found.  ASSESSMENT AND PLAN  Yaminah was seen today for headache and  hypertension.  Diagnoses and all orders for this visit:  Episodic tension-type headache, not intractable: She has no alarm features and her weight loss is secondary to diet changes. She is very worried about the combination of HA and weight loss, and I advised that the two are unlikely to be related, and her HA may improve with decreased worry and stress.  See AVS for patient guidance, neuro in three weeks if not improved.      The patient is advised to call or return to clinic if she does not see an improvement in  symptoms, or to seek the care of the closest emergency department if she worsens with the above plan.   Philis Fendt, MHS, PA-C [facility] 03/23/2016 3:47 PM

## 2016-06-21 ENCOUNTER — Ambulatory Visit (INDEPENDENT_AMBULATORY_CARE_PROVIDER_SITE_OTHER): Payer: Commercial Managed Care - HMO | Admitting: Family Medicine

## 2016-06-21 VITALS — BP 150/84 | HR 75 | Temp 98.4°F | Resp 17 | Ht 63.0 in | Wt 166.0 lb

## 2016-06-21 DIAGNOSIS — D72829 Elevated white blood cell count, unspecified: Secondary | ICD-10-CM

## 2016-06-21 DIAGNOSIS — N898 Other specified noninflammatory disorders of vagina: Secondary | ICD-10-CM

## 2016-06-21 DIAGNOSIS — R350 Frequency of micturition: Secondary | ICD-10-CM | POA: Diagnosis not present

## 2016-06-21 DIAGNOSIS — I10 Essential (primary) hypertension: Secondary | ICD-10-CM

## 2016-06-21 LAB — POC MICROSCOPIC URINALYSIS (UMFC): MUCUS RE: ABSENT

## 2016-06-21 LAB — POCT URINALYSIS DIP (MANUAL ENTRY)
Bilirubin, UA: NEGATIVE
Glucose, UA: NEGATIVE
Ketones, POC UA: NEGATIVE
Nitrite, UA: NEGATIVE
PROTEIN UA: NEGATIVE
RBC UA: NEGATIVE
SPEC GRAV UA: 1.015
UROBILINOGEN UA: 0.2
pH, UA: 6

## 2016-06-21 LAB — POCT WET + KOH PREP
TRICH BY WET PREP: ABSENT
YEAST BY KOH: ABSENT
Yeast by wet prep: ABSENT

## 2016-06-21 MED ORDER — ESTROGENS, CONJUGATED 0.625 MG/GM VA CREA: 1.0000 | TOPICAL_CREAM | Freq: Every day | VAGINAL | 2 refills | Status: DC

## 2016-06-21 MED ORDER — AMLODIPINE BESYLATE 5 MG PO TABS: 5.0000 mg | ORAL_TABLET | Freq: Every day | ORAL | 3 refills | Status: DC

## 2016-06-21 MED ORDER — METRONIDAZOLE 500 MG PO TABS: 500.0000 mg | ORAL_TABLET | Freq: Two times a day (BID) | ORAL | 0 refills | Status: DC

## 2016-06-21 NOTE — Progress Notes (Signed)
By signing my name below, I, Mesha Guinyard, attest that this documentation has been prepared under the direction and in the presence of Delman Cheadle, MD.  Electronically Signed: Verlee Monte, Medical Scribe. 06/21/16. 11:03 AM.  Subjective:    Patient ID: Pamela Small, female    DOB: 1946-02-18, 70 y.o.   MRN: [id number]  HPI Chief Complaint  Patient presents with  . Urinary Frequency    onset 4 months on and off/ malodorous and off colored    HPI Comments: NACHELLE APPLEBAUM is a 70 y.o. female who presents to the [facility] complaining of brown vaginal discharge since Sept (3 months ago). Pt reports mid back pain, and bloat/gassy. Pt reports having similar sxs in the past and she took abx for relief of her sxs. She does not have an GYN and she had a hysterectomy with both ovaries and uterus removed; her left ovaries had fibroids and no cancer was found. Denies dysuria, nausea, and vomiting.  She reports HAs and takes flexeril for her sxs. Pt has been taking flexeril thinking it was her bp medication. Her bp at home runs 150-190/80. Pt took metformin for a month and discontinued it since it upset her stomach. Pt uses miralax for her occasional constipation. She does not eat fried foods or soda as much and she no longer eat tomatoe seeds and cucumbers. Denies swelling in her legs, and lower leg swelling while taking her medication.  Patient Active Problem List   Diagnosis Date Noted  . Thyroid nodule 06/12/2012  . Wheezing 06/06/2012  . Pulmonary nodules 06/06/2012  . 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 Chest pain 06/04/2012  . HTN (hypertension) 06/04/2012  . Hyperlipidemia    Past Medical History:  Diagnosis Date  . Asthma   . Hyperlipidemia   . Hypertension   . Lung nodules    seen on CT scan 2013  . Multiple thyroid nodules    US guided bx was benign in 2013   Past Surgical History:  Procedure Laterality Date  . ABDOMINAL HYSTERECTOMY    . COLONOSCOPY      . FOOT ARTHRODESIS, MODIFIED MCBRIDE    . POLYPECTOMY     No Known Allergies Prior to Admission medications   Medication Sig Start Date End Date Taking? Authorizing Provider  albuterol (PROVENTIL HFA;VENTOLIN HFA) 108 (90 BASE) MCG/ACT inhaler Inhale 2 puffs into the lungs every 6 (six) hours as needed for wheezing or shortness of breath. 05/01/15  Yes Thao P Le, DO  cyclobenzaprine (FLEXERIL) 5 MG tablet Take 1-2 tablets (5-10 mg total) by mouth at bedtime. 07/31/15  Yes [name], PA-C  lovastatin (MEVACOR) 20 MG tablet take 1 tablet by mouth at bedtime 01/25/16  Yes [name], PA-C  polyethylene glycol powder (GLYCOLAX/MIRALAX) powder Take 17 g by mouth daily as needed. 07/31/15  Yes [name], PA-C  pregabalin (LYRICA) 50 MG capsule Take 1 capsule (50 mg total) by mouth 3 (three) times daily. 12/31/15  Yes [name], PA-C  metFORMIN (GLUCOPHAGE) 500 MG tablet Take 1 tablet (500 mg total) by mouth 2 (two) times daily with a meal. Patient not taking: Reported on 06/21/2016 12/31/15   [name], PA-C   Social History   Social History  . Marital status: Married    Spouse name: N/A  . Number of children: N/A  . Years of education: N/A   Occupational History  . Not on file.   Social History Main Topics  . Smoking status:  Never Smoker  . Smokeless tobacco: Never Used  . Alcohol use No  . Drug use: No  . Sexual activity: No   Other Topics Concern  . Not on file   Social History Narrative  . No narrative on file   Review of Systems  Cardiovascular: Negative for leg swelling.  Gastrointestinal: Positive for abdominal distention and constipation. Negative for nausea and vomiting.  Genitourinary: Positive for vaginal discharge. Negative for dysuria.  Musculoskeletal: Positive for back pain.   Objective:  Physical Exam  Constitutional: She appears well-developed and well-nourished. No distress.  HENT:  Head: Normocephalic and atraumatic.  Eyes: Conjunctivae are normal.   Neck: Neck supple.  Cardiovascular: Normal rate, regular rhythm, S1 normal, S2 normal and normal heart sounds.  Exam reveals no friction rub.   No murmur heard. Pulmonary/Chest: Effort normal and breath sounds normal. No respiratory distress. She has no wheezes. She has no rales.  Abdominal: There is tenderness (Mild, diffusely). There is no rebound, no guarding and no CVA tenderness.  Genitourinary:  Genitourinary Comments: External perineum nl but vaginal introitus showed erythema, friability, dry and in pain Positive amine odor  Neurological: She is alert.  Skin: Skin is warm and dry.  Psychiatric: She has a normal mood and affect. Her behavior is normal.  Nursing note and vitals reviewed.  BP (!) 150/84 (BP Location: Right Arm, Patient Position: Sitting, Cuff Size: Normal)   Pulse 75   Temp 98.4 F (36.9 C) (Oral)   Resp 17   Ht 5\' 3"  (1.6 m)   Wt 166 lb (75.3 kg)   SpO2 97%   BMI 29.41 kg/m    Results for orders placed or performed in visit on 06/21/16  POCT urinalysis dipstick  Result Value Ref Range   Color, UA yellow yellow   Clarity, UA clear clear   Glucose, UA negative negative   Bilirubin, UA negative negative   Ketones, POC UA negative negative   Spec Grav, UA 1.015    Blood, UA negative negative   pH, UA 6.0    Protein Ur, POC negative negative   Urobilinogen, UA 0.2    Nitrite, UA Negative Negative   Leukocytes, UA moderate (2+) (A) Negative  POCT Wet + KOH Prep  Result Value Ref Range   Yeast by KOH Absent Absent   Yeast by wet prep Absent Absent   WBC by wet prep None (A) Few   Clue Cells Wet Prep HPF POC Few (A) None   Trich by wet prep Absent Absent   Bacteria Wet Prep HPF POC Few Few   Epithelial Cells By [employer] Pref ([facility]) Few None, Few, Too numerous to count   RBC,UR,HPF,POC None None RBC/hpf   Assessment & Plan:   1. Urinary frequency   2. Vaginal discharge   3. Leukocytosis, unspecified type   4. Essential hypertension, benign     Orders  Placed This Encounter  Procedures  . Urine culture  . POCT Microscopic Urinalysis ([facility])  . POCT urinalysis dipstick  . POCT Wet + KOH Prep    Meds ordered this encounter  Medications  . amLODipine (NORVASC) 5 MG tablet    Sig: Take 1 tablet (5 mg total) by mouth daily.    Dispense:  90 tablet    Refill:  3  . metroNIDAZOLE (FLAGYL) 500 MG tablet    Sig: Take 1 tablet (500 mg total) by mouth 2 (two) times daily.    Dispense:  14 tablet    Refill:  0  . conjugated estrogens (PREMARIN) vaginal cream    Sig: Place 1 Applicatorful vaginally at bedtime. Every other night for 2 weeks, then twice a week.    Dispense:  30 g    Refill:  2    I personally performed the services described in this documentation, which was scribed in my presence. The recorded information has been reviewed and considered, and addended by me as needed.   Delman Cheadle, M.D.  [facility] [address] [place], [place] [address] [phone] phone [phone] fax  07/22/16 3:11 AM

## 2016-06-21 NOTE — Patient Instructions (Addendum)
IF you received an x-ray today, you will receive an invoice from [facility]. Please contact [facility] at [phone] with questions or concerns regarding your invoice.   IF you received labwork today, you will receive an invoice from [employer]. Please contact [facility] at [phone] with questions or concerns regarding your invoice.   Our billing staff will not be able to assist you with questions regarding bills from these companies.  You will be contacted with the lab results as soon as they are available. The fastest way to get your results is to activate your My Chart account. Instructions are located on the last page of this paperwork. If you have not heard from [place] regarding the results in 2 weeks, please contact this office.    Atrophic Vaginitis Introduction Atrophic vaginitis is a condition in which the tissues that line the vagina become dry and thin. This condition is most common in women who have stopped having regular menstrual periods (menopause). This usually starts when a woman is 24-35 years old. Estrogen helps to keep the vagina moist. It stimulates the vagina to produce a clear fluid that lubricates the vagina for sexual intercourse. This fluid also protects the vagina from infection. Lack of estrogen can cause the lining of the vagina to get thinner and dryer. The vagina may also shrink in size. It may become less elastic. Atrophic vaginitis tends to get worse over time as a woman's estrogen level drops. What are the causes? This condition is caused by the normal drop in estrogen that happens around the time of menopause. What increases the risk? Certain conditions or situations may lower a woman's estrogen level, which increases her risk of atrophic vaginitis. These include:  Taking medicine that blocks estrogen.  Having ovaries removed surgically.  Being treated for cancer with X-ray treatment (radiation) or  medicines (chemotherapy).  Exercising very hard and often.  Having an eating disorder (anorexia).  Giving birth or breastfeeding.  Being over the age of 36.  Smoking. What are the signs or symptoms? Symptoms of this condition include:  Pain, soreness, or bleeding during sexual intercourse (dyspareunia).  Vaginal burning, irritation, or itching.  Pain or bleeding during a vaginal examination using a speculum (pelvic exam).  Loss of interest in sexual activity.  Having burning pain when passing urine.  Vaginal discharge that is brown or yellow. In some cases, there are no symptoms. How is this diagnosed? This condition is diagnosed with a medical history and physical exam. This will include a pelvic exam that checks whether the inside of your vagina appears pale, thin, or dry. Rarely, you may also have other tests, including:  A urine test.  A test that checks the acid balance in your vaginal fluid (acid balance test). How is this treated? Treatment for this condition may depend on the severity of your symptoms. Treatment may include:  Using an over-the-counter vaginal lubricant before you have sexual intercourse.  Using a long-acting vaginal moisturizer.  Using low-dose vaginal estrogen for moderate to severe symptoms that do not respond to other treatments. Options include creams, tablets, and inserts (vaginal rings). Before using vaginal estrogen, tell your health care provider if you have a history of:  Breast cancer.  Endometrial cancer.  Blood clots.  Taking medicines. You may be able to take a daily pill for dyspareunia. Discuss all of the risks of this medicine with your health care provider. It is usually not recommended for women who have a family  history or personal history of breast cancer. If your symptoms are very mild and you are not sexually active, you may not need treatment. Follow these instructions at home:  Take medicines only as directed by your  health care provider. Do not use herbal or alternative medicines unless your health care provider says that you can.  Use over-the-counter creams, lubricants, or moisturizers for dryness only as directed by your health care provider.  If your atrophic vaginitis is caused by menopause, discuss all of your menopausal symptoms and treatment options with your health care provider.  Do not douche.  Do not use products that can make your vagina dry. These include:  Scented feminine sprays.  Scented tampons.  Scented soaps.  If it hurts to have sex, talk with your sexual partner. Contact a health care provider if:  Your discharge looks different than normal.  Your vagina has an unusual smell.  You have new symptoms.  Your symptoms do not improve with treatment.  Your symptoms get worse. This information is not intended to replace advice given to you by your health care provider. Make sure you discuss any questions you have with your health care provider. Document Released: 11/18/2014 Document Revised: 12/10/2015 Document Reviewed: 06/25/2014  2017 [employer]

## 2016-06-23 LAB — URINE CULTURE

## 2016-09-02 ENCOUNTER — Other Ambulatory Visit: Payer: Self-pay

## 2016-09-02 MED ORDER — ALBUTEROL SULFATE HFA 108 (90 BASE) MCG/ACT IN AERS: 2.0000 | INHALATION_SPRAY | Freq: Four times a day (QID) | RESPIRATORY_TRACT | 0 refills | Status: DC | PRN

## 2016-09-02 NOTE — Telephone Encounter (Signed)
Fax req [facility] [place] ProAir HFA - snet 1 with -0- refills Pt due for return appt 09/2016

## 2016-09-22 ENCOUNTER — Encounter: Payer: Commercial Managed Care - HMO | Admitting: Family Medicine

## 2016-09-24 NOTE — Progress Notes (Signed)
Patient ID: Pamela Small, female   DOB: 01-01-1946, 70 y.o.   MRN: [id number] Subjective:    Pamela Small is a 71 y.o. female who presents for Medicare Annual/Subsequent preventive examination.  She has not had an wellness/physical prior in Epic.  Preventive Screening-Counseling & Management  Tobacco History  Smoking Status  . Never Smoker  Smokeless Tobacco  . Never Used     Problems Prior to Visit 1. HTN: Checks outside the office. At her visit 3 mos ago reported it was running 150-180/80 on no meds so was restarted on amlodipine 5 (h/o non-compliance). 2. HLD: lovastatin 20; last lipid panel 04/2015 showed t chol 325, ldl 249. Was seen by cardiology the month prior but perhaps been non-compliant with her meds but stated she WAS taking her crestor at that time for 4 mos. 3. Asthma:prn albuterol 3. Urinary freq w/ int change in color and odor seen 3 mos prior. Exam showed friable dry erythematous vagina with amine odor so presumptively treated for BV with flagyl 500 bid x 7d and then started premarin vag supp.  S/p TAH w/ BSO for benign indications.  Pt now tells me that actually she was having a yellow discharge AND vaginal spotting which has now resolved with the premarin. 4. Pre-DMII: hgba1c 5.7-> 6.4 -> 6.2. Could not tolerate metformin. Trying to watch diet (no fried foods, soda). 5. Occ constipation treated with miralax 6.  Arthitis of right elbow pain: aspercreme has not been tried. Aleve doesn't works "as there is no codeine in it."    Current Problems (verified) Patient Active Problem List   Diagnosis Date Noted  . Thyroid nodule 06/12/2012  . Wheezing 06/06/2012  . Pulmonary nodules 06/06/2012  . 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 Chest pain 06/04/2012  . HTN (hypertension) 06/04/2012  . Hyperlipidemia     Medications Prior to Visit Current Outpatient Prescriptions on File Prior to Visit  Medication Sig Dispense Refill  . albuterol (PROVENTIL HFA;VENTOLIN HFA) 108  (90 Base) MCG/ACT inhaler Inhale 2 puffs into the lungs every 6 (six) hours as needed for wheezing or shortness of breath. 1 Inhaler 0  . amLODipine (NORVASC) 5 MG tablet Take 1 tablet (5 mg total) by mouth daily. 90 tablet 3  . conjugated estrogens (PREMARIN) vaginal cream Place 1 Applicatorful vaginally at bedtime. Every other night for 2 weeks, then twice a week. 30 g 2  . lovastatin (MEVACOR) 20 MG tablet take 1 tablet by mouth at bedtime 90 tablet 0  . metroNIDAZOLE (FLAGYL) 500 MG tablet Take 1 tablet (500 mg total) by mouth 2 (two) times daily. 14 tablet 0  . polyethylene glycol powder (GLYCOLAX/MIRALAX) powder Take 17 g by mouth daily as needed. 500 g 1  . pregabalin (LYRICA) 50 MG capsule Take 1 capsule (50 mg total) by mouth 3 (three) times daily. 90 capsule 2   No current facility-administered medications on file prior to visit.     Current Medications (verified) Current Outpatient Prescriptions  Medication Sig Dispense Refill  . albuterol (PROVENTIL HFA;VENTOLIN HFA) 108 (90 Base) MCG/ACT inhaler Inhale 2 puffs into the lungs every 6 (six) hours as needed for wheezing or shortness of breath. 1 Inhaler 0  . amLODipine (NORVASC) 5 MG tablet Take 1 tablet (5 mg total) by mouth daily. 90 tablet 3  . conjugated estrogens (PREMARIN) vaginal cream Place 1 Applicatorful vaginally at bedtime. Every other night for 2 weeks, then twice a week. 30 g 2  . lovastatin (MEVACOR) 20 MG tablet  take 1 tablet by mouth at bedtime 90 tablet 0  . metroNIDAZOLE (FLAGYL) 500 MG tablet Take 1 tablet (500 mg total) by mouth 2 (two) times daily. 14 tablet 0  . polyethylene glycol powder (GLYCOLAX/MIRALAX) powder Take 17 g by mouth daily as needed. 500 g 1  . pregabalin (LYRICA) 50 MG capsule Take 1 capsule (50 mg total) by mouth 3 (three) times daily. 90 capsule 2   No current facility-administered medications for this visit.      Allergies (verified) Patient has no known allergies.   PAST  HISTORY  Family History Family History  Problem Relation Age of Onset  . Breast cancer Daughter   . Hyperlipidemia Mother   . Diabetes Maternal Grandmother   . Asthma Mother     Social History Social History  Substance Use Topics  . Smoking status: Never Smoker  . Smokeless tobacco: Never Used  . Alcohol use No     Are there smokers in your home (other than you)? No - people smoke outside the house but stopped smoking inside since she started watching her grandbabby  Risk Factors Current exercise habits: Home exercise routine includes walking chasing grandbabby hrs per day. As well as 20-30 min 3x/wk  Dietary issues discussed: avoiding fried foods but does skip meals, lots of cheese, spinach/leafy greens once a day though those do constipated. Does use some milk of magnesia which did not work as well with the fiber.   Cardiac risk factors: advanced age (older than 5 for men, 55 for women), dyslipidemia and hypertension.  Depression Screen Depression screen [facility] 2/9 06/21/2016 03/23/2016 12/31/2015 07/31/2015 05/01/2015  Decreased Interest 0 0 0 0 0  Down, Depressed, Hopeless 0 0 0 0 0  PHQ - 2 Score 0 0 0 0 0    Activities of Daily Living In your present state of health, do you have any difficulty performing the following activities?:  Driving? No - ran acroos the other side of the road Managing money?  No Feeding yourself? No Getting from bed to chair? No Climbing a flight of stairs? No Preparing food and eating?: No Bathing or showering? No Getting dressed: No Getting to the toilet? No Using the toilet:No Moving around from place to place: No In the past year have you fallen or had a near fall?:No   Are you sexually active?  No - Pts husband passed away 06/12/14 unexpectedly while doing maintenance work.  Do you have more than one partner?  No  Hearing Difficulties: No Do you often ask people to speak up or repeat themselves? No Do you experience ringing or noises in  your ears? Yes - occ hears in either but not both her, not freq Do you have difficulty understanding soft or whispered voices? Yes   Do you feel that you have a problem with memory? Yes  Do you often misplace items? Yes  Do you feel safe at home?  Yes  Cognitive Testing  Alert? Yes  Normal Appearance?Yes  Oriented to person? Yes  Place? Yes   Time? Yes  Recall of three objects?  Yes  Can perform simple calculations? Yes  Displays appropriate judgment?Yes     Advanced Directives have been discussed with the patient? Yes  List the Names of Other Physician/Practitioners you currently use: 1.  [employer] [place] in [place] 2-3 years. 2.  No longer seeing Dr. Anne Shutter for cardiac  Indicate any recent Medical Services you may have received from other than [facility] providers in  the past year (date may be approximate).  Immunization History  Administered Date(s) Administered  . Influenza Split 07/19/2010    Screening Tests Health Maintenance  Topic Date Due  . Hepatitis C Screening  Jan 11, 1946  . DEXA SCAN  10/20/2010  . PNA vac Low Risk Adult (1 of 2 - PCV13) 10/20/2010  . INFLUENZA VACCINE  11/19/2016 (Originally 02/16/2016)  . MAMMOGRAM  10/06/2017  . COLONOSCOPY  12/05/2020  . TETANUS/TDAP  12/05/2020    All answers were reviewed with the patient and necessary referrals were made:  [facility], MD   09/24/2016   History reviewed: allergies, current medications, past family history, past medical history, past social history, past surgical history and problem list  Review of Systems Pertinent items are noted in HPI.    Objective:  BP 129/87   Pulse 87   Temp 98.4 F (36.9 C) (Oral)   Resp 16   Ht 5\' 3"  (1.6 m)   Wt 169 lb (76.7 kg)   SpO2 96%   BMI 29.94 kg/m   Visual Acuity Screening   Right eye Left eye Both eyes  Without correction:     With correction: 20/70 20/70 20/40     BP 129/87   Pulse 87   Temp 98.4 F (36.9 C) (Oral)   Resp 16   Ht 5\' 3"  (1.6 m)    Wt 169 lb (76.7 kg)   SpO2 96%   BMI 29.94 kg/m   General Appearance:    Alert, cooperative, no distress, appears stated age  Head:    Normocephalic, without obvious abnormality, atraumatic  Eyes:    PERRL, conjunctiva/corneas clear, EOM's intact, fundi    benign, both eyes  Ears:    Normal TM's and external ear canals, both ears  Nose:   Nares normal, septum midline, mucosa normal, no drainage    or sinus tenderness  Throat:   Lips, mucosa, and tongue normal; teeth and gums normal  Neck:   Supple, symmetrical, trachea midline, no adenopathy;    thyroid:  no enlargement/tenderness/nodules; no carotid   bruit or JVD  Back:     Symmetric, no curvature, ROM normal, no CVA tenderness  Lungs:     Clear to auscultation bilaterally, respirations unlabored  Chest Wall:    No tenderness or deformity   Heart:    Regular rate and rhythm, S1 and S2 normal, no murmur, rub   or gallop  Breast Exam:    No tenderness, masses, or nipple abnormality  Abdomen:     Soft, non-tender, bowel sounds active all four quadrants,    no masses, no organomegaly        Extremities:   Extremities normal, atraumatic, no cyanosis or edema  Pulses:   2+ and symmetric all extremities  Skin:   Skin color, texture, turgor normal, no rashes or lesions  Lymph nodes:   Cervical, supraclavicular, and axillary nodes normal  Neurologic:   CNII-XII intact, normal strength, sensation and reflexes    throughout       Assessment:     1. Medicare annual wellness visit, subsequent   2. Essential hypertension   3. Thyroid nodule   4. Hyperlipidemia, unspecified hyperlipidemia type   5. Estrogen deficiency   6. Encounter for screening mammogram for breast cancer   7. Routine screening for STI (sexually transmitted infection)   8. Screening for cardiovascular, respiratory, and genitourinary diseases   9. Screening for colorectal cancer   10. Need for prophylactic vaccination against Streptococcus pneumoniae (pneumococcus)  11. Prediabetes   12. Postmenopausal bleeding         Plan:  Ua, hgba1c, hep C, cbc, cmp, lipid, tsh dexa scan - try to sched at same time as upcoming mammogram this mo, no piror dexa on chart  prevnar-13 reluctnatly ok but refuses, flu vaccine   refer to eye doctor for poor vision, vision decline, HTN, pre-DM.  During the course of the visit the patient was educated and counseled about appropriate screening and preventive services including:     No pap/pelvic - s/p hysterectomy w/ BSO for benign indications  [facility] 09/2015 [facility]  Colonoscopy 11/2010 by Dr. Deatra Ina at [facility] - was normal so repeat in 10 yrs  Immunizations: tdap 2012, Need to update in immunization tab if she had - no pneumovaccine and agrees to that and shingles but no  Diet review for nutrition referral? Yes ____  Not Indicated ____    Orders Placed This Encounter  Procedures  . DG Bone Density    Standing Status:   Future    Standing Expiration Date:   11/25/2017    Order Specific Question:   Reason for Exam (SYMPTOM  OR DIAGNOSIS REQUIRED)    Answer:   estrogen deficiency; none prior    Order Specific Question:   Preferred imaging location?    Answer:   External    Comments:   [facility]  . Pneumococcal conjugate vaccine 13-valent IM  . CBC  . Lipid panel    Order Specific Question:   Has the patient fasted?    Answer:   Yes  . HCV Ab w/Rflx to Verification  . TSH  . Comprehensive metabolic panel    Order Specific Question:   Has the patient fasted?    Answer:   Yes  . Hemoglobin A1c  . Interpretation:  . Care order/instruction:    Scheduling Instructions:     Complete orders, AVS and go.  Marland Kitchen POCT urinalysis dipstick    Meds ordered this encounter  Medications  . Zoster Vaccine Live, PF, (ZOSTAVAX) 09983 UNT/0.65ML injection    Sig: Inject 19,400 Units into the skin once.    Dispense:  1 vial    Refill:  0  . albuterol (PROVENTIL HFA;VENTOLIN HFA) 108 (90 Base) MCG/ACT  inhaler    Sig: Inhale 2 puffs into the lungs every 4 (four) hours as needed for wheezing or shortness of breath.    Dispense:  1 Inhaler    Refill:  3      Delman Cheadle, M.D.  Primary Care at [facility] [address] [place], [place] [address] [phone] phone [phone] fax  11/10/16 11:06 PM  Patient Instructions (the written plan) was given to the patient.  Medicare Attestation I have personally reviewed: The patient's medical and social history Their use of alcohol, tobacco or illicit drugs Their current medications and supplements The patient's functional ability including ADLs,fall risks, home safety risks, cognitive, and hearing and visual impairment Diet and physical activities Evidence for depression or mood disorders  The patient's weight, height, BMI, and visual acuity have been recorded in the chart.  I have made referrals, counseling, and provided education to the patient based on review of the above and I have provided the patient with a written personalized care plan for preventive services.     Delman Cheadle, MD   09/24/2016

## 2016-09-25 DIAGNOSIS — R7303 Prediabetes: Secondary | ICD-10-CM | POA: Insufficient documentation

## 2016-09-26 ENCOUNTER — Ambulatory Visit (INDEPENDENT_AMBULATORY_CARE_PROVIDER_SITE_OTHER): Payer: Medicare HMO | Admitting: Family Medicine

## 2016-09-26 ENCOUNTER — Encounter: Payer: Self-pay | Admitting: Family Medicine

## 2016-09-26 VITALS — BP 129/87 | HR 87 | Temp 98.4°F | Resp 16 | Ht 63.0 in | Wt 169.0 lb

## 2016-09-26 DIAGNOSIS — Z1383 Encounter for screening for respiratory disorder NEC: Secondary | ICD-10-CM | POA: Diagnosis not present

## 2016-09-26 DIAGNOSIS — Z23 Encounter for immunization: Secondary | ICD-10-CM

## 2016-09-26 DIAGNOSIS — Z113 Encounter for screening for infections with a predominantly sexual mode of transmission: Secondary | ICD-10-CM | POA: Diagnosis not present

## 2016-09-26 DIAGNOSIS — R7303 Prediabetes: Secondary | ICD-10-CM | POA: Diagnosis not present

## 2016-09-26 DIAGNOSIS — Z1389 Encounter for screening for other disorder: Secondary | ICD-10-CM | POA: Diagnosis not present

## 2016-09-26 DIAGNOSIS — Z136 Encounter for screening for cardiovascular disorders: Secondary | ICD-10-CM | POA: Diagnosis not present

## 2016-09-26 DIAGNOSIS — E2839 Other primary ovarian failure: Secondary | ICD-10-CM

## 2016-09-26 DIAGNOSIS — E785 Hyperlipidemia, unspecified: Secondary | ICD-10-CM | POA: Diagnosis not present

## 2016-09-26 DIAGNOSIS — E041 Nontoxic single thyroid nodule: Secondary | ICD-10-CM

## 2016-09-26 DIAGNOSIS — Z0000 Encounter for general adult medical examination without abnormal findings: Secondary | ICD-10-CM | POA: Diagnosis not present

## 2016-09-26 DIAGNOSIS — Z1211 Encounter for screening for malignant neoplasm of colon: Secondary | ICD-10-CM

## 2016-09-26 DIAGNOSIS — N950 Postmenopausal bleeding: Secondary | ICD-10-CM

## 2016-09-26 DIAGNOSIS — Z1231 Encounter for screening mammogram for malignant neoplasm of breast: Secondary | ICD-10-CM

## 2016-09-26 DIAGNOSIS — I10 Essential (primary) hypertension: Secondary | ICD-10-CM

## 2016-09-26 DIAGNOSIS — Z1212 Encounter for screening for malignant neoplasm of rectum: Secondary | ICD-10-CM

## 2016-09-26 LAB — POCT URINALYSIS DIP (MANUAL ENTRY)
BILIRUBIN UA: NEGATIVE
Blood, UA: NEGATIVE
GLUCOSE UA: NEGATIVE
Ketones, POC UA: NEGATIVE
NITRITE UA: NEGATIVE
Protein Ur, POC: NEGATIVE
Spec Grav, UA: 1.015
Urobilinogen, UA: 0.2
pH, UA: 5.5

## 2016-09-26 MED ORDER — ALBUTEROL SULFATE HFA 108 (90 BASE) MCG/ACT IN AERS: 2.0000 | INHALATION_SPRAY | RESPIRATORY_TRACT | 3 refills | Status: DC | PRN

## 2016-09-26 MED ORDER — ZOSTER VACCINE LIVE 19400 UNT/0.65ML ~~LOC~~ SUSR: 0.6500 mL | Freq: Once | SUBCUTANEOUS | 0 refills | Status: AC

## 2016-09-26 NOTE — Patient Instructions (Signed)
     IF you received an x-ray today, you will receive an invoice from [facility]. Please contact [facility] at [phone] with questions or concerns regarding your invoice.   IF you received labwork today, you will receive an invoice from [facility]. Please contact [facility] at [phone] with questions or concerns regarding your invoice.   Our billing staff will not be able to assist you with questions regarding bills from these companies.  You will be contacted with the lab results as soon as they are available. The fastest way to get your results is to activate your My Chart account. Instructions are located on the last page of this paperwork. If you have not heard from [place] regarding the results in 2 weeks, please contact this office.     c

## 2016-09-27 LAB — COMPREHENSIVE METABOLIC PANEL
A/G RATIO: 1.6 (ref 1.2–2.2)
ALT: 8 IU/L (ref 0–32)
AST: 17 IU/L (ref 0–40)
Albumin: 4.5 g/dL (ref 3.5–4.8)
Alkaline Phosphatase: 171 IU/L — ABNORMAL HIGH (ref 39–117)
BUN/Creatinine Ratio: 22 (ref 12–28)
BUN: 21 mg/dL (ref 8–27)
Bilirubin Total: 0.5 mg/dL (ref 0.0–1.2)
CALCIUM: 9.4 mg/dL (ref 8.7–10.3)
CO2: 25 mmol/L (ref 18–29)
Chloride: 102 mmol/L (ref 96–106)
Creatinine, Ser: 0.96 mg/dL (ref 0.57–1.00)
GFR, EST AFRICAN AMERICAN: 69 mL/min/{1.73_m2} (ref >59)
GFR, EST NON AFRICAN AMERICAN: 60 mL/min/{1.73_m2} (ref >59)
GLOBULIN, TOTAL: 2.9 g/dL (ref 1.5–4.5)
Glucose: 116 mg/dL — ABNORMAL HIGH (ref 65–99)
POTASSIUM: 4.8 mmol/L (ref 3.5–5.2)
SODIUM: 144 mmol/L (ref 134–144)
TOTAL PROTEIN: 7.4 g/dL (ref 6.0–8.5)

## 2016-09-27 LAB — TSH: TSH: 0.371 u[IU]/mL — ABNORMAL LOW (ref 0.450–4.500)

## 2016-09-27 LAB — CBC
Hematocrit: 43.9 % (ref 34.0–46.6)
Hemoglobin: 14.6 g/dL (ref 11.1–15.9)
MCH: 28.4 pg (ref 26.6–33.0)
MCHC: 33.3 g/dL (ref 31.5–35.7)
MCV: 85 fL (ref 79–97)
PLATELETS: 275 10*3/uL (ref 150–379)
RBC: 5.14 x10E6/uL (ref 3.77–5.28)
RDW: 15.4 % (ref 12.3–15.4)
WBC: 5.8 10*3/uL (ref 3.4–10.8)

## 2016-09-27 LAB — LIPID PANEL
CHOLESTEROL TOTAL: 333 mg/dL — AB (ref 100–199)
Chol/HDL Ratio: 6.3 ratio units — ABNORMAL HIGH (ref 0.0–4.4)
HDL: 53 mg/dL (ref >39)
LDL Calculated: 261 mg/dL — ABNORMAL HIGH (ref 0–99)
Triglycerides: 96 mg/dL (ref 0–149)
VLDL CHOLESTEROL CAL: 19 mg/dL (ref 5–40)

## 2016-09-27 LAB — HEMOGLOBIN A1C
ESTIMATED AVERAGE GLUCOSE: 126 mg/dL
HEMOGLOBIN A1C: 6 % — AB (ref 4.8–5.6)

## 2016-09-27 LAB — HCV AB W/RFLX TO VERIFICATION: HCV Ab: <0.1 s/co ratio (ref 0.0–0.9)

## 2016-09-27 LAB — HCV INTERPRETATION

## 2017-01-30 DIAGNOSIS — M8589 Other specified disorders of bone density and structure, multiple sites: Secondary | ICD-10-CM | POA: Diagnosis not present

## 2017-01-30 DIAGNOSIS — Z780 Asymptomatic menopausal state: Secondary | ICD-10-CM | POA: Diagnosis not present

## 2017-01-30 DIAGNOSIS — Z1231 Encounter for screening mammogram for malignant neoplasm of breast: Secondary | ICD-10-CM | POA: Diagnosis not present

## 2017-01-30 LAB — HM MAMMOGRAPHY

## 2017-01-30 LAB — HM DEXA SCAN

## 2017-02-08 ENCOUNTER — Telehealth: Payer: Self-pay

## 2017-02-08 NOTE — Telephone Encounter (Signed)
Order sent to [facility] for further dx screening.

## 2018-04-05 DIAGNOSIS — Z780 Asymptomatic menopausal state: Secondary | ICD-10-CM | POA: Insufficient documentation

## 2018-04-05 DIAGNOSIS — E559 Vitamin D deficiency, unspecified: Secondary | ICD-10-CM | POA: Insufficient documentation

## 2018-10-10 DIAGNOSIS — E890 Postprocedural hypothyroidism: Secondary | ICD-10-CM | POA: Insufficient documentation

## 2018-12-31 DIAGNOSIS — R748 Abnormal levels of other serum enzymes: Secondary | ICD-10-CM | POA: Insufficient documentation

## 2019-04-22 DIAGNOSIS — E7801 Familial hypercholesterolemia: Secondary | ICD-10-CM | POA: Insufficient documentation

## 2019-04-22 DIAGNOSIS — R000 Tachycardia, unspecified: Secondary | ICD-10-CM | POA: Insufficient documentation

## 2021-12-30 ENCOUNTER — Ambulatory Visit
Admission: EM | Admit: 2021-12-30 | Discharge: 2021-12-30 | Disposition: A | Discharge status: Home / Self Care | Payer: Medicare (Managed Care) | Attending: Family Medicine | Admitting: Family Medicine

## 2021-12-30 DIAGNOSIS — E890 Postprocedural hypothyroidism: Secondary | ICD-10-CM | POA: Diagnosis not present

## 2021-12-30 DIAGNOSIS — I10 Essential (primary) hypertension: Secondary | ICD-10-CM | POA: Diagnosis not present

## 2021-12-30 MED ORDER — AMLODIPINE BESYLATE 5 MG PO TABS: 5.0000 mg | ORAL_TABLET | Freq: Every day | ORAL | 2 refills | Status: DC

## 2021-12-30 MED ORDER — LEVOTHYROXINE SODIUM 50 MCG PO TABS: 50.0000 ug | ORAL_TABLET | Freq: Every day | ORAL | 2 refills | Status: DC

## 2021-12-30 MED ORDER — LOVASTATIN 10 MG PO TABS: 10.0000 mg | ORAL_TABLET | Freq: Every day | ORAL | 2 refills | Status: DC

## 2021-12-30 NOTE — Discharge Instructions (Addendum)
Continue your amlodipine, levothyroxine and lovastatin.

## 2021-12-30 NOTE — ED Triage Notes (Signed)
Pt presents for refill on thyroid and blood pressure medication.

## 2021-12-30 NOTE — ED Provider Notes (Addendum)
[facility]    CSN: [id number] Arrival date & time: 12/30/21  1412      History   Chief Complaint Chief Complaint  Patient presents with   Medication Refill    HPI Pamela Small is a 76 y.o. female.    Medication Refill  Here for hypertension and hypothyroidism.  She has recently moved to the area, from [place].  She does not have a primary care appointment for establishing care until late August of this year.  She is not having any problems with fever or chest pain.  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      Home Medications    Prior to Admission medications   Medication Sig Start Date End Date Taking? Authorizing Provider  lovastatin (MEVACOR) 10 MG tablet Take 1 tablet (10 mg total) by mouth at bedtime. 12/30/21  Yes Barrett Henle, MD  amLODipine (NORVASC) 5 MG tablet Take 1 tablet (5 mg total) by mouth daily. 12/30/21   Barrett Henle, MD  levothyroxine (SYNTHROID) 50 MCG tablet Take 1 tablet (50 mcg total) by mouth daily before breakfast. 12/30/21   Tyeshia Cornforth, Gwenlyn Perking, MD    Family History Family History  Problem Relation Age of Onset   Breast cancer Daughter     Hyperlipidemia Mother    Asthma Mother    Diabetes Maternal Grandmother     Social History Social History   Tobacco Use   Smoking status: Never   Smokeless tobacco: Never  Substance Use Topics   Alcohol use: No   Drug use: No     Allergies   Patient has no known allergies.   Review of Systems Review of Systems   Physical Exam Triage Vital Signs ED Triage Vitals  Enc Vitals Group     BP 12/30/21 1431 133/85     Pulse Rate 12/30/21 1431 86     Resp 12/30/21 1431 18     Temp 12/30/21 1431 98 F (36.7 C)     Temp Source 12/30/21 1431 Oral     SpO2 12/30/21 1431 96 %     Weight --      Height --      Head Circumference --      Peak Flow --      Pain Score 12/30/21 1427 0     Pain Loc --      Pain Edu? --      Excl. in [place]? --    No data found.  Updated Vital Signs BP 133/85 (BP  Location: Left Arm)   Pulse 86   Temp 98 F (36.7 C) (Oral)   Resp 18   SpO2 96%   Visual Acuity Right Eye Distance:   Left Eye Distance:   Bilateral Distance:    Right Eye Near:   Left Eye Near:    Bilateral Near:     Physical Exam Vitals reviewed.  Constitutional:      General: She is not in acute distress.    Appearance: She is not ill-appearing, toxic-appearing or diaphoretic.  HENT:     Mouth/Throat:     Mouth: Mucous membranes are moist.     Pharynx: No oropharyngeal exudate or posterior oropharyngeal erythema.  Eyes:     Extraocular Movements: Extraocular movements intact.     Conjunctiva/sclera: Conjunctivae normal.     Pupils: Pupils are equal, round, and reactive to light.  Cardiovascular:     Rate and Rhythm: Normal rate and regular rhythm.     Heart sounds: No murmur heard. Pulmonary:     Effort: Pulmonary effort is normal.     Breath sounds: Normal breath sounds.  Musculoskeletal:     Cervical back: Neck supple.  Lymphadenopathy:     Cervical: No cervical adenopathy.  Skin:    Coloration: Skin is not jaundiced or pale.  Neurological:     General:  No focal deficit present.     Mental Status: She is alert and oriented to person, place, and time.  Psychiatric:        Behavior: Behavior normal.      UC Treatments / Results  Labs (all labs ordered are listed, but only abnormal results are displayed) Labs Reviewed - No data to display  EKG   Radiology No results found.  Procedures Procedures (including critical care time)  Medications Ordered in UC Medications - No data to display  Initial Impression / Assessment and Plan / UC Course  I have reviewed the triage vital signs and the nursing notes.  Pertinent labs & imaging results that were available during my care of the patient were reviewed by me and considered in my medical decision making (see chart for details).     Her amlodipine levothyroxine and lovastatin are sent in for 30-day supplies with 2 refills. Final Clinical Impressions(s) / UC Diagnoses   Final diagnoses:  Essential hypertension  Postablative hypothyroidism     Discharge Instructions      Continue your amlodipine, levothyroxine and lovastatin.    ED Prescriptions     Medication Sig Dispense Auth. Provider   amLODipine (NORVASC) 5 MG tablet Take 1 tablet (5 mg total) by mouth daily. 30 tablet Barrett Henle, MD   levothyroxine (SYNTHROID) 50 MCG tablet Take 1 tablet (50 mcg total) by mouth daily before breakfast. 30 tablet Chabeli Barsamian, Gwenlyn Perking, MD   lovastatin (MEVACOR) 10 MG tablet Take 1 tablet (10 mg total) by mouth at bedtime. 30 tablet Keyoni Lapinski, Gwenlyn Perking, MD      PDMP not reviewed this encounter.   Barrett Henle, MD 12/30/21 1932    Barrett Henle, MD 12/30/21 [phone]

## 2022-01-12 ENCOUNTER — Ambulatory Visit: Payer: Commercial Managed Care - HMO | Admitting: Internal Medicine

## 2022-03-07 ENCOUNTER — Encounter: Payer: Self-pay | Admitting: Family Medicine

## 2022-03-07 ENCOUNTER — Ambulatory Visit (INDEPENDENT_AMBULATORY_CARE_PROVIDER_SITE_OTHER): Payer: Medicare (Managed Care) | Admitting: Family Medicine

## 2022-03-07 ENCOUNTER — Other Ambulatory Visit (HOSPITAL_COMMUNITY): Payer: Self-pay

## 2022-03-07 VITALS — BP 160/89 | HR 66 | Temp 98.1°F | Resp 16 | Ht 62.0 in | Wt 165.4 lb

## 2022-03-07 DIAGNOSIS — E7801 Familial hypercholesterolemia: Secondary | ICD-10-CM

## 2022-03-07 DIAGNOSIS — I10 Essential (primary) hypertension: Secondary | ICD-10-CM

## 2022-03-07 DIAGNOSIS — Z7689 Persons encountering health services in other specified circumstances: Secondary | ICD-10-CM | POA: Diagnosis not present

## 2022-03-07 DIAGNOSIS — E890 Postprocedural hypothyroidism: Secondary | ICD-10-CM | POA: Diagnosis not present

## 2022-03-07 DIAGNOSIS — Z86010 Personal history of colonic polyps: Secondary | ICD-10-CM | POA: Insufficient documentation

## 2022-03-07 MED ORDER — AMLODIPINE BESYLATE 5 MG PO TABS
5.0000 mg | ORAL_TABLET | Freq: Every day | ORAL | 1 refills | Status: DC
  Filled 2022-03-07: qty 30, 30d supply, fill #0

## 2022-03-07 NOTE — Progress Notes (Unsigned)
New Patient Office Visit  Subjective    Patient ID: Pamela Small, female    DOB: 1946/02/22  Age: 76 y.o. MRN: [id number]  CC:  Chief Complaint  Patient presents with   Establish Care    HPI Pamela Small presents to establish care ***  Outpatient Encounter Medications as of 03/07/2022  Medication Sig   ezetimibe (ZETIA) 10 MG tablet Take by mouth.   propylthiouracil (PTU) 50 MG tablet 1/2 tab every other day   rosuvastatin (CRESTOR) 20 MG tablet Take by mouth.   solifenacin (VESICARE) 5 MG tablet Take by mouth.   [DISCONTINUED] levothyroxine (SYNTHROID) 50 MCG tablet TAKE 1 TABLET BY MOUTH ONCE DAILY (EARLY)   [DISCONTINUED] lovastatin (MEVACOR) 10 MG tablet Take by mouth.   amLODipine (NORVASC) 5 MG tablet Take 1 tablet (5 mg total) by mouth daily.   Cholecalciferol 50 MCG (2000 UT) TABS Take by mouth.   cyanocobalamin (VITAMIN B12) 1000 MCG tablet Take by mouth.   levothyroxine (SYNTHROID) 50 MCG tablet Take 1 tablet (50 mcg total) by mouth daily before breakfast.   Multiple Vitamin (DAILY VITES) tablet Take 1 tablet by mouth at bedtime.   [DISCONTINUED] lovastatin (MEVACOR) 10 MG tablet Take 1 tablet (10 mg total) by mouth at bedtime.   No facility-administered encounter medications on file as of 03/07/2022.    Past Medical History:  Diagnosis Date   Asthma    Hyperlipidemia    Hypertension    Lung nodules    seen on CT scan 2013   Multiple thyroid nodules    US guided bx was benign in 2013    Past Surgical History:  Procedure Laterality Date   ABDOMINAL HYSTERECTOMY     COLONOSCOPY     FOOT ARTHRODESIS, MODIFIED MCBRIDE     POLYPECTOMY      Family History  Problem Relation Age of Onset   Breast cancer Daughter    Hyperlipidemia Mother    Asthma Mother    Diabetes Maternal Grandmother     Social History   Socioeconomic History   Marital status: Married    Spouse name: Not on file   Number of children: Not on file   Years of education: Not  on file   Highest education level: Not on file  Occupational History   Not on file  Tobacco Use   Smoking status: Never   Smokeless tobacco: Never  Substance and Sexual Activity   Alcohol use: No   Drug use: No   Sexual activity: Never  Other Topics Concern   Not on file  Social History Narrative   Not on file   Social Determinants of Health   Financial Resource Strain: Not on file  Food Insecurity: Not on file  Transportation Needs: Not on file  Physical Activity: Not on file  Stress: Not on file  Social Connections: Not on file  Intimate Partner Violence: Not on file    ROS      Objective    BP (!) 160/89   Pulse 66   Temp 98.1 F (36.7 C) (Oral)   Resp 16   Ht '5\' 2"'$  (1.575 m)   Wt 165 lb 6.4 oz (75 kg)   SpO2 97%   BMI 30.25 kg/m   Physical Exam  {Labs (Optional):23779}    Assessment & Plan:   Problem List Items Addressed This Visit   None Visit Diagnoses     Essential hypertension    -  Primary   Relevant Medications  ezetimibe (ZETIA) 10 MG tablet   rosuvastatin (CRESTOR) 20 MG tablet   Encounter to establish care           No follow-ups on file.   Becky Sax, MD

## 2022-03-10 ENCOUNTER — Telehealth: Payer: Self-pay | Admitting: Family Medicine

## 2022-03-10 NOTE — Telephone Encounter (Signed)
Copied from [place] [id number]. Topic: General - Inquiry >> Mar 10, 2022 11:10 AM Marcellus Scott wrote: Reason for LGS:[id number] from [facility] stated they received an extra lavender tube with no test and wanted to clarify if they needed to add a test.  Please advise.

## 2022-03-10 NOTE — Telephone Encounter (Signed)
I have attempted without success to contact this patient by phone to return their call. No xtra tube needed

## 2022-03-11 LAB — LIPID PANEL
Chol/HDL Ratio: 5.9 ratio — ABNORMAL HIGH (ref 0.0–4.4)
Cholesterol, Total: 356 mg/dL — ABNORMAL HIGH (ref 100–199)
HDL: 60 mg/dL (ref >39)
LDL Chol Calc (NIH): 282 mg/dL — ABNORMAL HIGH (ref 0–99)
Triglycerides: 90 mg/dL (ref 0–149)
VLDL Cholesterol Cal: 14 mg/dL (ref 5–40)

## 2022-03-11 LAB — BASIC METABOLIC PANEL
BUN/Creatinine Ratio: 19 (ref 12–28)
BUN: 18 mg/dL (ref 8–27)
CO2: 25 mmol/L (ref 20–29)
Calcium: 9.2 mg/dL (ref 8.7–10.3)
Chloride: 103 mmol/L (ref 96–106)
Creatinine, Ser: 0.93 mg/dL (ref 0.57–1.00)
Glucose: 101 mg/dL — ABNORMAL HIGH (ref 70–99)
Potassium: 4.5 mmol/L (ref 3.5–5.2)
Sodium: 140 mmol/L (ref 134–144)
eGFR: 64 mL/min/{1.73_m2} (ref >59)

## 2022-03-11 LAB — SPECIMEN STATUS REPORT

## 2022-03-11 LAB — TSH: TSH: 42.5 u[IU]/mL — ABNORMAL HIGH (ref 0.450–4.500)

## 2022-03-11 LAB — AST: AST: 22 IU/L (ref 0–40)

## 2022-03-11 LAB — T4, FREE: Free T4: 0.59 ng/dL — ABNORMAL LOW (ref 0.82–1.77)

## 2022-03-11 LAB — ALT: ALT: 13 IU/L (ref 0–32)

## 2022-03-12 ENCOUNTER — Other Ambulatory Visit: Payer: Self-pay | Admitting: Pharmacist

## 2022-03-12 NOTE — Progress Notes (Signed)
Blood Pressure Screening Event:  Met with patient at community blood pressure screening event.   Reviewed recent visit with Dr. Redmond Pulling. She restarted amlodipine ~ 2 days ago. Discussed improvement in blood pressure.   Reviewed that her cholesterol was elevated. She notes that she previously had joint pains with rosuvastatin 20 mg daily. Recommended to discuss restarting at a lower dose to see if better tolerated. Unlikely that moderate intensity statin alone or with ezetimibe restarted would be potent enough to get to goal LDL <100. Recommend addition of PCSK9i.   Encouraged patient to keep follow up with PCP for review of lab results. Provided with blood pressure log to check BP at home   Catie TJodi Mourning, PharmD, [facility] [phone]

## 2022-03-23 ENCOUNTER — Ambulatory Visit (INDEPENDENT_AMBULATORY_CARE_PROVIDER_SITE_OTHER): Payer: Medicare (Managed Care) | Admitting: Family Medicine

## 2022-03-23 ENCOUNTER — Encounter: Payer: Self-pay | Admitting: Family Medicine

## 2022-03-23 VITALS — BP 136/78 | HR 69 | Temp 97.7°F | Resp 16 | Wt 161.6 lb

## 2022-03-23 DIAGNOSIS — E890 Postprocedural hypothyroidism: Secondary | ICD-10-CM | POA: Diagnosis not present

## 2022-03-23 DIAGNOSIS — I10 Essential (primary) hypertension: Secondary | ICD-10-CM | POA: Diagnosis not present

## 2022-03-23 DIAGNOSIS — E7801 Familial hypercholesterolemia: Secondary | ICD-10-CM | POA: Diagnosis not present

## 2022-03-23 MED ORDER — ROSUVASTATIN CALCIUM 20 MG PO TABS: 20.0000 mg | ORAL_TABLET | Freq: Every day | ORAL | 0 refills | Status: DC

## 2022-03-23 MED ORDER — LEVOTHYROXINE SODIUM 50 MCG PO TABS: 50.0000 ug | ORAL_TABLET | Freq: Every day | ORAL | 0 refills | Status: DC

## 2022-03-23 MED ORDER — EZETIMIBE 10 MG PO TABS: 10.0000 mg | ORAL_TABLET | Freq: Every day | ORAL | 0 refills | Status: DC

## 2022-03-23 NOTE — Progress Notes (Signed)
Patient is here for follow-up hypertension. Patient has no other concerns for provider today

## 2022-03-24 NOTE — Progress Notes (Signed)
Established Patient Office Visit  Subjective    Patient ID: Pamela Small, female    DOB: 1945/08/24  Age: 76 y.o. MRN: [id number]  CC:  Chief Complaint  Patient presents with   Follow-up   Hypertension    HPI Pamela Small presents for routine follow up of chronic med issues including hypertension.    Outpatient Encounter Medications as of 03/23/2022  Medication Sig   amLODipine (NORVASC) 5 MG tablet Take 1 tablet (5 mg total) by mouth daily.   atenolol (TENORMIN) 50 MG tablet Take by mouth.   Cholecalciferol 50 MCG (2000 UT) TABS Take by mouth.   cyanocobalamin (VITAMIN B12) 1000 MCG tablet Take by mouth.   Multiple Vitamin (DAILY VITES) tablet Take 1 tablet by mouth at bedtime.   propylthiouracil (PTU) 50 MG tablet 1/2 tab every other day   solifenacin (VESICARE) 5 MG tablet Take by mouth.   [DISCONTINUED] Cholecalciferol 50 MCG (2000 UT) CAPS TAKE ONE CAPSULE TWICE DAILY FOR LOW VITAMIN D   [DISCONTINUED] ezetimibe (ZETIA) 10 MG tablet Take by mouth.   [DISCONTINUED] levothyroxine (SYNTHROID) 50 MCG tablet Take 1 tablet (50 mcg total) by mouth daily before breakfast.   [DISCONTINUED] rosuvastatin (CRESTOR) 20 MG tablet Take by mouth.   acetaminophen (TYLENOL) 500 MG tablet Take by mouth.   albuterol (VENTOLIN HFA) 108 (90 Base) MCG/ACT inhaler Inhale into the lungs.   ezetimibe (ZETIA) 10 MG tablet Take 1 tablet (10 mg total) by mouth daily.   levothyroxine (SYNTHROID) 50 MCG tablet Take 1 tablet (50 mcg total) by mouth daily before breakfast.   rosuvastatin (CRESTOR) 20 MG tablet Take 1 tablet (20 mg total) by mouth daily.   [DISCONTINUED] B Complex-C (B-COMPLEX WITH VITAMIN C) tablet Take by mouth.   No facility-administered encounter medications on file as of 03/23/2022.    Past Medical History:  Diagnosis Date   Asthma    Hyperlipidemia    Hypertension    Lung nodules    seen on CT scan 2013   Multiple thyroid nodules    US guided bx was benign in 2013     Past Surgical History:  Procedure Laterality Date   ABDOMINAL HYSTERECTOMY     COLONOSCOPY     FOOT ARTHRODESIS, MODIFIED MCBRIDE     POLYPECTOMY      Family History  Problem Relation Age of Onset   Breast cancer Daughter    Hyperlipidemia Mother    Asthma Mother    Diabetes Maternal Grandmother     Social History   Socioeconomic History   Marital status: Married    Spouse name: Not on file   Number of children: Not on file   Years of education: Not on file   Highest education level: Not on file  Occupational History   Not on file  Tobacco Use   Smoking status: Never   Smokeless tobacco: Never  Substance and Sexual Activity   Alcohol use: No   Drug use: No   Sexual activity: Never  Other Topics Concern   Not on file  Social History Narrative   Not on file   Social Determinants of Health   Financial Resource Strain: Not on file  Food Insecurity: Not on file  Transportation Needs: Not on file  Physical Activity: Not on file  Stress: Not on file  Social Connections: Not on file  Intimate Partner Violence: Not on file    Review of Systems  All other systems reviewed and are negative.  Objective    BP 136/78   Pulse 69   Temp 97.7 F (36.5 C) (Oral)   Resp 16   Wt 161 lb 9.6 oz (73.3 kg)   SpO2 95%   BMI 29.56 kg/m   Physical Exam Vitals and nursing note reviewed.  Constitutional:      General: She is not in acute distress. Neck:     Thyroid: No thyromegaly or thyroid tenderness.  Cardiovascular:     Rate and Rhythm: Normal rate and regular rhythm.  Pulmonary:     Effort: Pulmonary effort is normal.     Breath sounds: Normal breath sounds.  Abdominal:     Palpations: Abdomen is soft.     Tenderness: There is no abdominal tenderness.  Musculoskeletal:     Cervical back: Normal range of motion and neck supple.  Neurological:     General: No focal deficit present.     Mental Status: She is alert and oriented to person, place,  and time.         Assessment & Plan:   1. Essential hypertension Appears stable. Continue and monitor  2. Postablative hypothyroidism Continue - meds refilled; compliance encouraged. Will monitor  3. Familial hypercholesterolemia Continue - meds refilled    Return in about 2 months (around 05/23/2022).   Becky Sax, MD

## 2022-05-14 ENCOUNTER — Ambulatory Visit
Admission: RE | Admit: 2022-05-14 | Discharge: 2022-05-14 | Disposition: A | Discharge status: Home / Self Care | Payer: Medicare (Managed Care) | Source: Ambulatory Visit | Attending: Nurse Practitioner | Admitting: Nurse Practitioner

## 2022-05-14 ENCOUNTER — Other Ambulatory Visit: Payer: Self-pay | Admitting: Nurse Practitioner

## 2022-05-14 DIAGNOSIS — Z1231 Encounter for screening mammogram for malignant neoplasm of breast: Secondary | ICD-10-CM

## 2022-05-23 ENCOUNTER — Ambulatory Visit: Payer: Medicare (Managed Care) | Admitting: Family Medicine

## 2022-05-23 DIAGNOSIS — J45909 Unspecified asthma, uncomplicated: Secondary | ICD-10-CM | POA: Diagnosis not present

## 2022-05-23 DIAGNOSIS — E785 Hyperlipidemia, unspecified: Secondary | ICD-10-CM | POA: Diagnosis not present

## 2022-05-23 DIAGNOSIS — I10 Essential (primary) hypertension: Secondary | ICD-10-CM | POA: Diagnosis not present

## 2022-05-23 DIAGNOSIS — R928 Other abnormal and inconclusive findings on diagnostic imaging of breast: Secondary | ICD-10-CM | POA: Diagnosis not present

## 2022-05-23 DIAGNOSIS — Z6828 Body mass index (BMI) 28.0-28.9, adult: Secondary | ICD-10-CM | POA: Diagnosis not present

## 2022-05-23 DIAGNOSIS — Z0001 Encounter for general adult medical examination with abnormal findings: Secondary | ICD-10-CM | POA: Diagnosis not present

## 2022-05-23 DIAGNOSIS — Z7189 Other specified counseling: Secondary | ICD-10-CM | POA: Diagnosis not present

## 2022-05-23 DIAGNOSIS — E039 Hypothyroidism, unspecified: Secondary | ICD-10-CM | POA: Diagnosis not present

## 2022-05-23 DIAGNOSIS — E663 Overweight: Secondary | ICD-10-CM | POA: Diagnosis not present

## 2022-05-23 DIAGNOSIS — N6311 Unspecified lump in the right breast, upper outer quadrant: Secondary | ICD-10-CM | POA: Diagnosis not present

## 2022-05-23 DIAGNOSIS — Z23 Encounter for immunization: Secondary | ICD-10-CM | POA: Diagnosis not present

## 2022-05-23 DIAGNOSIS — N63 Unspecified lump in unspecified breast: Secondary | ICD-10-CM | POA: Diagnosis not present

## 2022-05-31 ENCOUNTER — Other Ambulatory Visit: Payer: Self-pay | Admitting: Nurse Practitioner

## 2022-05-31 DIAGNOSIS — N63 Unspecified lump in unspecified breast: Secondary | ICD-10-CM

## 2022-06-01 ENCOUNTER — Other Ambulatory Visit: Payer: Self-pay | Admitting: Radiology

## 2022-06-01 DIAGNOSIS — N6311 Unspecified lump in the right breast, upper outer quadrant: Secondary | ICD-10-CM | POA: Diagnosis not present

## 2022-06-01 DIAGNOSIS — R590 Localized enlarged lymph nodes: Secondary | ICD-10-CM | POA: Diagnosis not present

## 2022-06-01 DIAGNOSIS — N6011 Diffuse cystic mastopathy of right breast: Secondary | ICD-10-CM | POA: Diagnosis not present

## 2022-06-01 DIAGNOSIS — R921 Mammographic calcification found on diagnostic imaging of breast: Secondary | ICD-10-CM | POA: Diagnosis not present

## 2022-06-01 DIAGNOSIS — N62 Hypertrophy of breast: Secondary | ICD-10-CM | POA: Diagnosis not present

## 2022-06-01 DIAGNOSIS — C50411 Malignant neoplasm of upper-outer quadrant of right female breast: Secondary | ICD-10-CM | POA: Diagnosis not present

## 2022-06-03 ENCOUNTER — Encounter: Payer: Self-pay | Admitting: Nurse Practitioner

## 2022-06-07 ENCOUNTER — Encounter: Payer: Self-pay | Admitting: *Deleted

## 2022-06-07 DIAGNOSIS — Z170 Estrogen receptor positive status [ER+]: Secondary | ICD-10-CM | POA: Insufficient documentation

## 2022-06-10 ENCOUNTER — Telehealth: Payer: Self-pay | Admitting: Hematology and Oncology

## 2022-06-10 NOTE — Telephone Encounter (Signed)
Spoke to patient to confirm morning clinic appointment for 11/29, gave location and time, patient was eager to get off the phone, wasn't able to go into detail about appointment too much

## 2022-06-14 ENCOUNTER — Other Ambulatory Visit: Payer: Self-pay | Admitting: *Deleted

## 2022-06-14 DIAGNOSIS — Z170 Estrogen receptor positive status [ER+]: Secondary | ICD-10-CM

## 2022-06-14 NOTE — Progress Notes (Signed)
Radiation Oncology         [phone] ________________________________  Initial Outpatient Consultation  Name: Pamela Small MRN: [id number]  Date: 06/15/2022  DOB: 08-Mar-1946  CC:Georganna Skeans, MD  Emelia Loron, MD   REFERRING PHYSICIAN: Emelia Loron, MD  DIAGNOSIS:    ICD-10-CM   1. 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 female who was found to have palpable lumps in both breasts during a routine wellness exam by her PCP. This prompted a bilateral diagnostic mammogram on 05/23/22 which showed a high density mass in the upper outer right breast measuring 1.8 x 1.4 cm, located 10 cmfn, correlating with the palpable abnormality. Associated segmental course calcifications were also appreciated spanning 3 cm anterior to the mass in the upper outer breast, tracking from the middle to posterior depths. No mammographic correlate was seen to account for the palpable left breast abnormality. Ultrasound of the bilateral breasts on that same date showed the upper outer (10 o'clock) right breast mass as highly suggestive of malignancy, measuring 1.4 x 1.6 x 1.4 cm sonographically. The associated segmental calcifications were again appreciated and appeared to be indeterminate.  An abnormal lymph node with eccentric cortical thickening was also appreciated in the right axilla,  measuring approximately 4 mm. (Ultrasound also confirmed no abnormalities in the left breast).   Biopsy of the 10 o'clock right breast (11 cmfn) on 06/01/22 revealed grade 1 invasive ductal carcinoma measuring 1.1 cm in the greatest linear extent of the sample with associated calcifications. Prognostic indicators significant for: estrogen receptor 100% positive with strong staining intensity; progesterone receptor 0% negative; Proliferation marker Ki67 at 5%; Her2 status negative; Grade 1. Biopsy of the 10 o'clock right breast 8 cmfn also performed showed no evidence of malignancy.  The segmental calcifications were also biopsied and showed no evidence of malignancy.  Biopsy of the right axillary lymph node showed benign findings and this was felt to be concordant per tumor board discussion.   Today she reports to be doing well overall. She is very busy with taking care of other people in her life currently and hasn't found much time for herself.   PREVIOUS RADIATION THERAPY: Previous ablative procedure to her thyroid in [place]; unsure if it was radioactive.  PAST MEDICAL HISTORY:  has a past medical history of Asthma, Hyperlipidemia, Hypertension, Lung nodules, and Multiple thyroid nodules.    PAST SURGICAL HISTORY: Past Surgical History:  Procedure Laterality Date   ABDOMINAL HYSTERECTOMY     COLONOSCOPY     FOOT ARTHRODESIS, MODIFIED MCBRIDE     POLYPECTOMY      FAMILY HISTORY: family history includes Asthma in her mother; Breast cancer in her daughter; Diabetes in her maternal grandmother; Hyperlipidemia in her mother.  SOCIAL HISTORY:  reports that she has never smoked. She has never used smokeless tobacco. She reports that she does not drink alcohol and does not  use drugs.  ALLERGIES: Guaifenesin  MEDICATIONS:  Current Outpatient Medications  Medication Sig Dispense Refill   acetaminophen (TYLENOL) 500 MG tablet Take by mouth.     albuterol (VENTOLIN HFA) 108 (90 Base) MCG/ACT  inhaler Inhale into the lungs.     amLODipine (NORVASC) 5 MG tablet Take 1 tablet (5 mg total) by mouth daily. 30 tablet 1   atenolol (TENORMIN) 50 MG tablet Take by mouth.     Cholecalciferol 50 MCG (2000 UT) TABS Take by mouth.     cyanocobalamin (VITAMIN B12) 1000 MCG tablet Take by mouth.     ezetimibe (ZETIA) 10 MG tablet Take 1 tablet (10 mg total) by mouth daily. 90 tablet 0   levothyroxine (SYNTHROID) 50 MCG tablet Take 1 tablet (50 mcg total) by mouth daily before breakfast. 90 tablet 0   Multiple Vitamin (DAILY VITES) tablet Take 1 tablet by mouth at bedtime.     propylthiouracil (PTU) 50 MG tablet 1/2 tab every other day     rosuvastatin (CRESTOR) 20 MG tablet Take 1 tablet (20 mg total) by mouth daily. 90 tablet 0   solifenacin (VESICARE) 5 MG tablet Take by mouth.     No current facility-administered medications for this encounter.    REVIEW OF SYSTEMS: As above in HPI.   PHYSICAL EXAM:  vitals were not taken for this visit.   General: Alert and oriented, in no acute distress HEENT: Head is normocephalic. Extraocular movements are intact. Oropharynx is clear. Neck: Neck is supple, no palpable cervical or supraclavicular lymphadenopathy. Heart: Regular in rate and rhythm with no murmurs, rubs, or gallops. Chest: Clear to auscultation bilaterally, with no rhonchi, wheezes, or rales. Abdomen: Soft, nontender, nondistended, with no rigidity or guarding. Extremities: No cyanosis or edema. Lymphatics: see Neck Exam Skin: No concerning lesions. Musculoskeletal: symmetric strength and muscle tone throughout. Neurologic: Cranial nerves II through XII are grossly intact. No obvious focalities. Speech is fluent. Coordination is intact. Psychiatric: Judgment and insight are intact. Affect is appropriate. Breasts: 2 cm mass in the UOQ of the right breast. No other palpable masses appreciated in the breasts or axillae.   ECOG = 0  0 - Asymptomatic (Fully active, able to carry on all  predisease activities without restriction)  1 - Symptomatic but completely ambulatory (Restricted in physically strenuous activity but ambulatory and able to carry out work of a light or sedentary nature. For example, light housework, office work)  2 - Symptomatic, <50% in bed during the day (Ambulatory and capable of all self care but unable to carry out any work activities. Up and about more than 50% of waking hours)  3 - Symptomatic, >50% in bed, but not bedbound (Capable of only limited self-care, confined to bed or chair 50% or more of waking hours)  4 - Bedbound (Completely disabled. Cannot carry on any self-care. Totally confined to bed or chair)  5 - Death   Santiago Glad MM, Creech RH, Tormey DC, et al. [phone]). "Toxicity and response criteria of the [facility]". Am. Evlyn Clines. Oncol. 5 (6): 649-55   LABORATORY DATA:  Lab Results  Component Value Date   WBC 5.2 06/15/2022   HGB 15.0 06/15/2022   HCT 46.3 (H) 06/15/2022   MCV 88.9 06/15/2022   PLT 265 06/15/2022   CMP     Component Value Date/Time   NA 138 06/15/2022 0817   NA 140 03/07/2022 1026   K 4.3 06/15/2022 0817   CL 103 06/15/2022 0817  CO2 29 06/15/2022 0817   GLUCOSE 119 (H) 06/15/2022 0817   BUN 20 06/15/2022 0817   BUN 18 03/07/2022 1026   CREATININE 1.21 (H) 06/15/2022 0817   CREATININE 0.79 12/31/2015 1450   CALCIUM 9.6 06/15/2022 0817   PROT 7.6 06/15/2022 0817   PROT 7.4 09/26/2016 0947   ALBUMIN 4.4 06/15/2022 0817   ALBUMIN 4.5 09/26/2016 0947   AST 18 06/15/2022 0817   ALT 14 06/15/2022 0817   ALKPHOS 138 (H) 06/15/2022 0817   BILITOT 0.4 06/15/2022 0817   GFRNONAA 46 (L) 06/15/2022 0817   GFRNONAA 74 05/01/2015 1528   GFRAA 69 09/26/2016 0947   GFRAA 86 05/01/2015 1528        RADIOGRAPHY: As above, I personally reviewed her imaging    IMPRESSION/PLAN: 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  For the patient's early stage favorable risk breast cancer, we had a thorough discussion about her options for adjuvant therapy. One option would be antiestrogen therapy as discussed with medical oncology. She would take a pill for approximately 5 years. The more aggressive option would be to pursue both modalities.   Of note, I discussed the data from the [employer] al trial in the [place] Journal of Medicine. She understands that tamoxifen compared to radiation plus tamoxifen demonstrated no survival benefit among the women in this study. The women were 70 years or older with stage I estrogen receptor positive breast cancer. Based on this study, I told the patient that her overall life expectancy should not be affected by adding radiotherapy to antiestrogen medication. She understands that the main benefit of  adding radiotherapy to anti estrogen therapy would be a very small but measurable local control benefit (risk of local recurrence to be lowered from ~9% --> ~2% over a decade).  We discussed the fact that radiotherapy only provides a local control benefit while anti-estrogen pills provide systemic coverage. That being said, the risk of systemic failure is relatively low with her type of breast cancer.   We discussed the risks benefits and side effects of radiotherapy. She understands that the side effects would likely include some skin irritation and fatigue during the weeks of radiation. There is a risk of late effects which include but are not necessarily limited to cosmetic changes and rare lung toxicity. I would anticipate delivering approximately 1-3 weeks of radiotherapy    After a thorough discussion of standard hypofractionation over 3 weeks, we discussed 1 week of ultra hypofractionated radiation therapy. I discussed that this approach is a bit less standard than longer regimens.  The Fast Forward trial (1 week) method has shown a slight increase in  normal tissue side effects over the 3 week hypofractionation standard at 5 years of follow-up. This includes effects like induration, and edema.  However, for elderly patients that are keen on the convenience of minimizing the number of procedures/visits to the cancer center, this is a nevertheless a reasonable approach to consider and the vast majority of patients do very well. There is also an option of RT once weekly for 5 weeks that we discussed.   She is leaning towards proceeding with radiation therapy and which option she would like to go with.    We spoke about other general acute effects of breast radiation including skin irritation and fatigue as well as breast fibrosis, induration, and /or swelling long term,  and much less common late effects including internal organ injury or irritation. We spoke about the latest technology that is used to minimize the risk of late effects for patients undergoing radiotherapy to the breast or chest wall. No guarantees of treatment were given. The patient is enthusiastic about proceeding with treatment.  I look forward to participating in the patient's care.  I will await her referral back to me for postoperative follow-up and eventual CT simulation/treatment planning.    On date of service, in total, I spent 50 minutes on this encounter. Patient was seen in person.   __________________________________________   Joyice Faster, PA   Lonie Peak, MD  This document serves as a record of services personally performed by Lonie Peak, MD. It was created on her behalf by Neena Rhymes, a trained medical scribe. The creation of this record is based on the scribe's personal observations and the provider's statements to them. This document has been checked and approved by the attending provider.

## 2022-06-15 ENCOUNTER — Inpatient Hospital Stay: Payer: Medicare (Managed Care) | Admitting: Licensed Clinical Social Worker

## 2022-06-15 ENCOUNTER — Encounter: Payer: Self-pay | Admitting: *Deleted

## 2022-06-15 ENCOUNTER — Inpatient Hospital Stay: Payer: Medicare (Managed Care) | Attending: Hematology and Oncology

## 2022-06-15 ENCOUNTER — Other Ambulatory Visit: Payer: Self-pay

## 2022-06-15 ENCOUNTER — Ambulatory Visit
Admission: RE | Admit: 2022-06-15 | Discharge: 2022-06-15 | Disposition: A | Discharge status: Home / Self Care | Payer: Medicare (Managed Care) | Source: Ambulatory Visit | Attending: Radiation Oncology | Admitting: Radiation Oncology

## 2022-06-15 ENCOUNTER — Ambulatory Visit: Payer: Medicare (Managed Care) | Admitting: Physical Therapy

## 2022-06-15 ENCOUNTER — Inpatient Hospital Stay (HOSPITAL_BASED_OUTPATIENT_CLINIC_OR_DEPARTMENT_OTHER): Payer: Medicare (Managed Care) | Admitting: Hematology and Oncology

## 2022-06-15 ENCOUNTER — Telehealth: Payer: Self-pay | Admitting: Genetic Counselor

## 2022-06-15 VITALS — BP 159/79 | HR 79 | Temp 98.1°F | Resp 18 | Ht 62.0 in | Wt 158.2 lb

## 2022-06-15 DIAGNOSIS — Z803 Family history of malignant neoplasm of breast: Secondary | ICD-10-CM | POA: Insufficient documentation

## 2022-06-15 DIAGNOSIS — Z90710 Acquired absence of both cervix and uterus: Secondary | ICD-10-CM

## 2022-06-15 DIAGNOSIS — Z170 Estrogen receptor positive status [ER+]: Secondary | ICD-10-CM | POA: Diagnosis not present

## 2022-06-15 DIAGNOSIS — C50411 Malignant neoplasm of upper-outer quadrant of right female breast: Secondary | ICD-10-CM | POA: Diagnosis not present

## 2022-06-15 DIAGNOSIS — I10 Essential (primary) hypertension: Secondary | ICD-10-CM | POA: Diagnosis not present

## 2022-06-15 LAB — CMP (CANCER CENTER ONLY)
ALT: 14 U/L (ref 0–44)
AST: 18 U/L (ref 15–41)
Albumin: 4.4 g/dL (ref 3.5–5.0)
Alkaline Phosphatase: 138 U/L — ABNORMAL HIGH (ref 38–126)
Anion gap: 6 (ref 5–15)
BUN: 20 mg/dL (ref 8–23)
CO2: 29 mmol/L (ref 22–32)
Calcium: 9.6 mg/dL (ref 8.9–10.3)
Chloride: 103 mmol/L (ref 98–111)
Creatinine: 1.21 mg/dL — ABNORMAL HIGH (ref 0.44–1.00)
GFR, Estimated: 46 mL/min — ABNORMAL LOW (ref >60)
Glucose, Bld: 119 mg/dL — ABNORMAL HIGH (ref 70–99)
Potassium: 4.3 mmol/L (ref 3.5–5.1)
Sodium: 138 mmol/L (ref 135–145)
Total Bilirubin: 0.4 mg/dL (ref 0.3–1.2)
Total Protein: 7.6 g/dL (ref 6.5–8.1)

## 2022-06-15 LAB — CBC WITH DIFFERENTIAL (CANCER CENTER ONLY)
Abs Immature Granulocytes: 0.01 10*3/uL (ref 0.00–0.07)
Basophils Absolute: 0 10*3/uL (ref 0.0–0.1)
Basophils Relative: 1 %
Eosinophils Absolute: 0.2 10*3/uL (ref 0.0–0.5)
Eosinophils Relative: 3 %
HCT: 46.3 % — ABNORMAL HIGH (ref 36.0–46.0)
Hemoglobin: 15 g/dL (ref 12.0–15.0)
Immature Granulocytes: 0 %
Lymphocytes Relative: 32 %
Lymphs Abs: 1.7 10*3/uL (ref 0.7–4.0)
MCH: 28.8 pg (ref 26.0–34.0)
MCHC: 32.4 g/dL (ref 30.0–36.0)
MCV: 88.9 fL (ref 80.0–100.0)
Monocytes Absolute: 0.3 10*3/uL (ref 0.1–1.0)
Monocytes Relative: 5 %
Neutro Abs: 3.1 10*3/uL (ref 1.7–7.7)
Neutrophils Relative %: 59 %
Platelet Count: 265 10*3/uL (ref 150–400)
RBC: 5.21 MIL/uL — ABNORMAL HIGH (ref 3.87–5.11)
RDW: 13.8 % (ref 11.5–15.5)
WBC Count: 5.2 10*3/uL (ref 4.0–10.5)
nRBC: 0 % (ref 0.0–0.2)

## 2022-06-15 LAB — GENETIC SCREENING ORDER

## 2022-06-15 NOTE — Progress Notes (Unsigned)
[place] NOTE  Patient Care Team: Dorna Mai, MD as PCP - General (Family Medicine) Melissa Montane, MD (Otolaryngology) Rockwell Germany, RN as Oncology Nurse Navigator Tressie Ellis, Paulette Blanch, RN as Oncology Nurse Navigator Benay Pike, MD as Consulting Physician (Hematology and Oncology) Rolm Bookbinder, MD as Consulting Physician (General Surgery) Eppie Gibson, MD as Attending Physician (Radiation Oncology)  CHIEF COMPLAINTS/PURPOSE OF CONSULTATION:  Newly diagnosed breast cancer  HISTORY OF PRESENTING ILLNESS:   Pamela Small 76 y.o. female is here because of recent diagnosis of right invasive breast cancer.  I reviewed her records extensively and collaborated the history with the patient.  SUMMARY OF ONCOLOGIC HISTORY: Oncology History  Malignant neoplasm of upper-outer quadrant of right breast in female, estrogen receptor positive ([place])  05/23/2022 Mammogram   Diagnostic mammogram showed a 1.8 x 1.4 cm round high density mass with spiculated margin in the right breast upper outer aspect posterior depth 10 cm from the nipple correlates with the palpable marker.  There are segmental coarse heterogeneous calcs spanning 3 cm anterior to the mass in the upper outer breast middle to posterior depth 7 cm from the nipple.  No other significant masses calcifications or findings in either breast.   06/01/2022 Pathology Results   Pathology from right breast needle core biopsy 10:00 11 cm showed invasive ductal carcinoma, overall grade 1, benign lymph node, rest of the breast biopsies were benign, negative for malignancy.  Prognostic showed ER 100% positive strong staining PR 0% negative Ki-67 of 5% HER2 equivocal by IHC, FISH negative   06/07/2022 Initial Diagnosis   Malignant neoplasm of upper-outer quadrant of right breast in female, estrogen receptor positive ([place])    Interval History  Patient is here for an initial visit by herself. She is healthy at  baseline.  She denies any new complaints. Rest of the pertinent 10 point ROS reviewed and negative  MEDICAL HISTORY:  Past Medical History:  Diagnosis Date   Asthma    Hyperlipidemia    Hypertension    Lung nodules    seen on CT scan 2013   Multiple thyroid nodules    US guided bx was benign in 2013    SURGICAL HISTORY: Past Surgical History:  Procedure Laterality Date   ABDOMINAL HYSTERECTOMY     COLONOSCOPY     FOOT ARTHRODESIS, MODIFIED MCBRIDE     POLYPECTOMY      SOCIAL HISTORY: Social History   Socioeconomic History   Marital status: Married    Spouse name: Not on file   Number of children: Not on file   Years of education: Not on file   Highest education level: Not on file  Occupational History   Not on file  Tobacco Use   Smoking status: Never   Smokeless tobacco: Never  Substance and Sexual Activity   Alcohol use: No   Drug use: No   Sexual activity: Never  Other Topics Concern   Not on file  Social History Narrative   Not on file   Social Determinants of Health   Financial Resource Strain: Low Risk  (06/15/2022)   Overall Financial Resource Strain (CARDIA)    Difficulty of Paying Living Expenses: Not very hard  Food Insecurity: No Food Insecurity (06/15/2022)   Hunger Vital Sign    Worried About Running Out of Food in the Last Year: Never true    Ran Out of Food in the Last Year: Never true  Transportation Needs: No Transportation Needs (06/15/2022)  PRAPARE - Hydrologist (Medical): No    Lack of Transportation (Non-Medical): No  Physical Activity: Not on file  Stress: Not on file  Social Connections: Not on file  Intimate Partner Violence: Not on file    FAMILY HISTORY: Family History  Problem Relation Age of Onset   Breast cancer Daughter    Hyperlipidemia Mother    Asthma Mother    Diabetes Maternal Grandmother     ALLERGIES:  is allergic to guaifenesin.  MEDICATIONS:  Current Outpatient  Medications  Medication Sig Dispense Refill   acetaminophen (TYLENOL) 500 MG tablet Take by mouth.     albuterol (VENTOLIN HFA) 108 (90 Base) MCG/ACT inhaler Inhale into the lungs.     amLODipine (NORVASC) 5 MG tablet Take 1 tablet (5 mg total) by mouth daily. 30 tablet 1   atenolol (TENORMIN) 50 MG tablet Take by mouth.     Cholecalciferol 50 MCG (2000 UT) TABS Take by mouth.     cyanocobalamin (VITAMIN B12) 1000 MCG tablet Take by mouth.     ezetimibe (ZETIA) 10 MG tablet Take 1 tablet (10 mg total) by mouth daily. 90 tablet 0   levothyroxine (SYNTHROID) 50 MCG tablet Take 1 tablet (50 mcg total) by mouth daily before breakfast. 90 tablet 0   Multiple Vitamin (DAILY VITES) tablet Take 1 tablet by mouth at bedtime.     propylthiouracil (PTU) 50 MG tablet 1/2 tab every other day     rosuvastatin (CRESTOR) 20 MG tablet Take 1 tablet (20 mg total) by mouth daily. 90 tablet 0   solifenacin (VESICARE) 5 MG tablet Take by mouth.     No current facility-administered medications for this visit.    REVIEW OF SYSTEMS:    Constitutional: Denies fevers, chills or abnormal night sweats Eyes: Denies blurriness of vision, double vision or watery eyes Ears, nose, mouth, throat, and face: Denies mucositis or sore throat Respiratory: Denies cough, dyspnea or wheezes Cardiovascular: Denies palpitation, chest discomfort or lower extremity swelling Gastrointestinal:  Denies nausea, heartburn or change in bowel habits Skin: Denies abnormal skin rashes Lymphatics: Denies new lymphadenopathy or easy bruising Neurological:Denies numbness, tingling or new weaknesses Behavioral/Psych: Mood is stable, no new changes  Breast:  Denies any palpable lumps or discharge All other systems were reviewed with the patient and are negative.  PHYSICAL EXAMINATION: ECOG PERFORMANCE STATUS: 0 - Asymptomatic  Vitals:   06/15/22 0848  BP: (!) 159/79  Pulse: 79  Resp: 18  Temp: 98.1 F (36.7 C)  SpO2: 100%   Filed  Weights   06/15/22 0848  Weight: 158 lb 3.2 oz (71.8 kg)    Physical Exam Constitutional:      Appearance: Normal appearance.  Cardiovascular:     Rate and Rhythm: Normal rate and regular rhythm.  Pulmonary:     Effort: Pulmonary effort is normal.     Breath sounds: Normal breath sounds.  Chest:       Comments: Palpable area of abnormality in the right breast upper outer quadrant measuring under 2 cms. No abnormal axillary LN Musculoskeletal:        General: No swelling. Normal range of motion.     Cervical back: Normal range of motion and neck supple. No rigidity.  Lymphadenopathy:     Cervical: No cervical adenopathy.  Neurological:     Mental Status: She is alert.      LABORATORY DATA:  I have reviewed the data as listed Lab Results  Component Value  Date   WBC 5.2 06/15/2022   HGB 15.0 06/15/2022   HCT 46.3 (H) 06/15/2022   MCV 88.9 06/15/2022   PLT 265 06/15/2022   Lab Results  Component Value Date   NA 138 06/15/2022   K 4.3 06/15/2022   CL 103 06/15/2022   CO2 29 06/15/2022    RADIOGRAPHIC STUDIES: I have personally reviewed the radiological reports and agreed with the findings in the report.  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  Given small tumor and strong ER positivity, we have discussed about proceeding with upfront surgery.  We have also discussed about Oncotype testing today.  We have discussed about Oncotype Dx score which is a well validated prognostic scoring system which can predict outcome with endocrine therapy alone and whether chemotherapy reduces recurrence.  Typically in patients with ER positive cancers that are  node negative if the RS score is high typically greater than or equal to 26, chemotherapy is recommended.  In women with intermediate recurrence score younger than 21, there can still be some role for chemotherapy in addition to endocrine therapy especially if the recurrence score is between 21-25. If chemotherapy is needed, this will precede radiation and then after radiation she will continue on antiestrogen therapy.  I do not believe it is likely that she will need adjuvant chemotherapy.  She at this time does not want to proceed with adjuvant radiation as well.  She would like to proceed with adjuvant antiestrogen therapy.  We have discussed about tamoxifen versus aromatase inhibitors, mechanism of action with each class, adverse effects from each class.  She at this time wants to take tamoxifen because its may be less expensive for her and she is also interested in tamoxifen because of benefits on bone density.  We have discussed about adverse effects including DVT/PE, endometrial carcinoma, endometrial hyperplasia.  She will return to clinic after surgery.  She was hoping to skip radiation.  Thank you for consulting [place] the care of this patient.  Please not hesitate to contact us with any additional questions or concerns.  Total time spent: 60 minutes including history, physical exam, review of records in the breast [place], counseling and coordination of care All questions were answered. The patient knows to call the clinic with any problems, questions or concerns.    Benay Pike, MD 06/16/22

## 2022-06-15 NOTE — Progress Notes (Signed)
[facility] Clinical Social Work  Initial Assessment   Pamela Small is a 76 y.o. year old female presenting alone. Clinical Social Work was referred by  [facility]  for assessment of psychosocial needs.   SDOH (Social Determinants of Health) assessments performed: Yes SDOH Interventions    Flowsheet Row Clinical Support from 06/15/2022 in [facility]  SDOH Interventions   Food Insecurity Interventions Intervention Not Indicated  Housing Interventions Intervention Not Indicated  Transportation Interventions Intervention Not Indicated  Financial Strain Interventions Intervention Not Indicated       SDOH Screenings   Food Insecurity: No Food Insecurity (06/15/2022)  Housing: Low Risk  (06/15/2022)  Transportation Needs: No Transportation Needs (06/15/2022)  Depression (PHQ2-9): Low Risk  (03/23/2022)  Financial Resource Strain: Low Risk  (06/15/2022)  Tobacco Use: Low Risk  (03/23/2022)     Distress Screen completed: Yes    06/15/2022   10:52 AM  ONCBCN DISTRESS SCREENING  Screening Type Initial Screening  Distress experienced in past week (1-10) 1  Spiritual/Religous concerns type Relating to God  Physical Problem type Skin dry/itchy      Family/Social Information:  Housing Arrangement: patient lives alone typically. Currently took in her nephew who is staying with her Family members/support persons in your life? Family, Friends, and [employer] concerns: no  Employment: Retired .  Income source: Paediatric nurse concerns: No Type of concern: None Food access concerns: no Religious or spiritual practice: Pamela Small is very important to her Services Currently in place:  n/a  Coping/ Adjustment to diagnosis: Patient understands treatment plan and what happens next? yes, understands the plan and feels calm. Feels she has been through more difficult times in her past (hysterectomy, husband's death) Concerns about  diagnosis and/or treatment: I'm not especially worried about anything Patient enjoys  sports, like watching football Current coping skills/ strengths: Ability for insight , Capable of independent living , Communication skills , and Religious Affiliation     SUMMARY: Current SDOH Barriers:  None noted today  Clinical Social Work Clinical Goal(s):  No clinical social work goals at this time  Interventions: Discussed common feeling and emotions when being diagnosed with cancer, and the importance of support during treatment Informed patient of the support team roles and support services at [facility] Provided [place] contact information and encouraged patient to call with any questions or concerns   Follow Up Plan: Patient will contact CSW with any support or resource needs Patient verbalizes understanding of plan: Yes    Pamela Small E Akia Montalban, LCSW

## 2022-06-15 NOTE — Telephone Encounter (Signed)
Pamela Small was seen by a genetic counselor during the breast multidisciplinary clinic on 06/15/2022. She reports no family history of cancer. She does not meet NCCN criteria for genetic testing at this time. She was still offered genetic counseling and testing but declined. We encourage her to contact us if there are any changes to her personal or family history of cancer. If she meets NCCN criteria based on the updated personal/family history, she would be recommended to have genetic counseling and testing.   Pamela Passy, MS, [facility] Genetic Counselor [email or web address] (P) [phone]

## 2022-06-15 NOTE — Research (Signed)
[employer] 2021-05 - Specimen Collection Study to Evaluate Biomarkers in Subjects with Cancer    Patient Pamela Small was identified by Dr. Chryl Heck as a potential candidate for the above listed study.  This Clinical Research Nurse met with AADHIRA HEFFERNAN, [id number], on 06/15/22 in a manner and location that ensures patient privacy to discuss participation in the above listed research study.  Patient is Unaccompanied.  A copy of the informed consent document with embedded HIPAA language was provided to the patient.  Patient reads, speaks, and understands [place].   Patient was provided with the business card of this Nurse and encouraged to contact the research team with any questions.  Approximately 15 minutes was spent with the patient reviewing the informed consent documents.  Patient was provided the option of taking informed consent documents home to review and was encouraged to review at their convenience with their support network, including other care providers. Patient took the consent documents home to review. The research team will contact the pt next week (pt prefers morning calls) to discuss her participation in the study.  The pt was told by Johny Drilling, research coordinator, that her participation is optional.  The pt was thanked for her time and consideration of this study.  Brion Aliment RN, BSN, CCRP Clinical Research Nurse Lead 06/15/2022 12:28 PM

## 2022-06-16 ENCOUNTER — Other Ambulatory Visit: Payer: Self-pay | Admitting: General Surgery

## 2022-06-16 DIAGNOSIS — Z170 Estrogen receptor positive status [ER+]: Secondary | ICD-10-CM

## 2022-06-16 NOTE — Assessment & Plan Note (Signed)
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  Given small tumor and strong ER positivity, we have discussed about proceeding with upfront surgery.  We have also discussed about Oncotype testing today.  We have discussed about Oncotype Dx score which is a well validated prognostic scoring system which can predict outcome with endocrine therapy alone and whether chemotherapy reduces recurrence.  Typically in patients with ER positive cancers that are node negative if the RS score is high typically greater than or equal to 26, chemotherapy is recommended.  In women with intermediate recurrence score younger than 28, there can still be some role for chemotherapy in addition to endocrine therapy especially if the recurrence score is between 21-25. If chemotherapy is needed, this will precede radiation and then after radiation she will continue on antiestrogen therapy.  I do not believe it is likely that she will need adjuvant chemotherapy.  She at this time does not want to proceed with adjuvant radiation as well.  She would like to proceed with adjuvant antiestrogen therapy.  We have discussed about tamoxifen versus aromatase inhibitors, mechanism of action with each class, adverse effects from each class.  She at this time wants to take tamoxifen because its may be less expensive for her and she is also interested in tamoxifen because of benefits on bone density.  We have discussed about adverse effects including DVT/PE, endometrial carcinoma, endometrial hyperplasia.  She will return to clinic after surgery.  She was hoping to skip radiation.  Thank you for consulting [place] the care of this patient.  Please not hesitate to contact us with any additional questions or concerns.

## 2022-06-17 ENCOUNTER — Other Ambulatory Visit: Payer: Self-pay | Admitting: *Deleted

## 2022-06-17 ENCOUNTER — Encounter: Payer: Self-pay | Admitting: *Deleted

## 2022-06-20 DIAGNOSIS — Z780 Asymptomatic menopausal state: Secondary | ICD-10-CM | POA: Diagnosis not present

## 2022-06-20 DIAGNOSIS — M85852 Other specified disorders of bone density and structure, left thigh: Secondary | ICD-10-CM | POA: Diagnosis not present

## 2022-06-20 DIAGNOSIS — M85851 Other specified disorders of bone density and structure, right thigh: Secondary | ICD-10-CM | POA: Diagnosis not present

## 2022-06-21 ENCOUNTER — Telehealth: Payer: Self-pay

## 2022-06-21 DIAGNOSIS — Z170 Estrogen receptor positive status [ER+]: Secondary | ICD-10-CM

## 2022-06-21 NOTE — Telephone Encounter (Signed)
[employer] 2021-05 - Specimen Collection Study to Evaluate Biomarkers in Subjects with Cancer    Attempted to reach patient about the above study, left a message. Will attempt again to contact patient about the study tomorrow.   Pamela Small, [facility] 06/21/2022 4:02 PM

## 2022-06-22 ENCOUNTER — Telehealth: Payer: Self-pay | Admitting: *Deleted

## 2022-06-22 ENCOUNTER — Encounter: Payer: Self-pay | Admitting: *Deleted

## 2022-06-22 ENCOUNTER — Telehealth: Payer: Self-pay

## 2022-06-22 DIAGNOSIS — C50411 Malignant neoplasm of upper-outer quadrant of right female breast: Secondary | ICD-10-CM

## 2022-06-22 DIAGNOSIS — Z170 Estrogen receptor positive status [ER+]: Secondary | ICD-10-CM

## 2022-06-22 NOTE — Telephone Encounter (Signed)
Spoke with patient to follow up from [facility] 11/29 and assess navigation needs.  Patient denies any questions or concerns at this time.  Encouraged her to call should anything arise.

## 2022-06-22 NOTE — Telephone Encounter (Signed)
[employer] 2021-05 - Specimen Collection Study to Evaluate Biomarkers in Subjects with Cancer    Called to follow up about the the above mentioned research study. Patient refused to participate in the study but did not provide any reason other than just now wanting to participate.   [place], [facility] 06/22/2022 10:01 AM

## 2022-06-29 ENCOUNTER — Telehealth: Payer: Self-pay | Admitting: Family Medicine

## 2022-06-29 NOTE — Telephone Encounter (Signed)
I spoke with patient to schedule AWV  Patient stated she has changed pcp  she goes to [facility] now

## 2022-07-27 ENCOUNTER — Encounter (HOSPITAL_BASED_OUTPATIENT_CLINIC_OR_DEPARTMENT_OTHER): Payer: Self-pay | Admitting: General Surgery

## 2022-07-27 ENCOUNTER — Other Ambulatory Visit: Payer: Self-pay

## 2022-08-02 ENCOUNTER — Encounter (HOSPITAL_BASED_OUTPATIENT_CLINIC_OR_DEPARTMENT_OTHER)
Admission: RE | Admit: 2022-08-02 | Discharge: 2022-08-02 | Disposition: A | Discharge status: Home / Self Care | Payer: Medicare HMO | Source: Ambulatory Visit | Attending: General Surgery | Admitting: General Surgery

## 2022-08-02 DIAGNOSIS — E039 Hypothyroidism, unspecified: Secondary | ICD-10-CM | POA: Diagnosis not present

## 2022-08-02 DIAGNOSIS — C50411 Malignant neoplasm of upper-outer quadrant of right female breast: Secondary | ICD-10-CM | POA: Diagnosis not present

## 2022-08-02 DIAGNOSIS — Z01812 Encounter for preprocedural laboratory examination: Secondary | ICD-10-CM | POA: Diagnosis not present

## 2022-08-02 DIAGNOSIS — Z170 Estrogen receptor positive status [ER+]: Secondary | ICD-10-CM | POA: Diagnosis not present

## 2022-08-02 DIAGNOSIS — I10 Essential (primary) hypertension: Secondary | ICD-10-CM | POA: Diagnosis not present

## 2022-08-02 MED ORDER — ENSURE PRE-SURGERY PO LIQD: 296.0000 mL | Freq: Once | ORAL | Status: DC

## 2022-08-02 NOTE — Progress Notes (Signed)

## 2022-08-03 NOTE — Anesthesia Preprocedure Evaluation (Addendum)
Anesthesia Evaluation  Patient identified by MRN, date of birth, ID band Patient awake    Reviewed: Allergy & Precautions, NPO status , Patient's Chart, lab work & pertinent test results  History of Anesthesia Complications Negative for: history of anesthetic complications  Airway Mallampati: II  TM Distance: >3 FB Neck ROM: Full    Dental  (+) Edentulous Lower, Edentulous Upper   Pulmonary asthma    Pulmonary exam normal        Cardiovascular hypertension, Pt. on medications Normal cardiovascular exam     Neuro/Psych negative neurological ROS  negative psych ROS   GI/Hepatic negative GI ROS, Neg liver ROS,,,  Endo/Other  Hypothyroidism   Pre-DM   Renal/GU negative Renal ROS     Musculoskeletal negative musculoskeletal ROS (+)    Abdominal   Peds  Hematology negative hematology ROS (+)   Anesthesia Other Findings   Reproductive/Obstetrics  Breast cancer                              Anesthesia Physical Anesthesia Plan  ASA: 2  Anesthesia Plan: General   Post-op Pain Management: Tylenol PO (pre-op)*   Induction: Intravenous  PONV Risk Score and Plan: 3 and Treatment may vary due to age or medical condition, Ondansetron and Dexamethasone  Airway Management Planned: LMA  Additional Equipment: None  Intra-op Plan:   Post-operative Plan: Extubation in OR  Informed Consent: I have reviewed the patients History and Physical, chart, labs and discussed the procedure including the risks, benefits and alternatives for the proposed anesthesia with the patient or authorized representative who has indicated his/her understanding and acceptance.     Dental advisory given  Plan Discussed with: CRNA and Anesthesiologist  Anesthesia Plan Comments:        Anesthesia Quick Evaluation

## 2022-08-04 ENCOUNTER — Ambulatory Visit (HOSPITAL_BASED_OUTPATIENT_CLINIC_OR_DEPARTMENT_OTHER)
Admission: RE | Admit: 2022-08-04 | Discharge: 2022-08-04 | Disposition: A | Discharge status: Home / Self Care | Payer: Medicare HMO | Attending: General Surgery | Admitting: General Surgery

## 2022-08-04 ENCOUNTER — Encounter (HOSPITAL_BASED_OUTPATIENT_CLINIC_OR_DEPARTMENT_OTHER): Payer: Self-pay | Admitting: General Surgery

## 2022-08-04 ENCOUNTER — Ambulatory Visit (HOSPITAL_BASED_OUTPATIENT_CLINIC_OR_DEPARTMENT_OTHER): Payer: Medicare HMO | Admitting: Anesthesiology

## 2022-08-04 ENCOUNTER — Encounter (HOSPITAL_BASED_OUTPATIENT_CLINIC_OR_DEPARTMENT_OTHER)
Admission: RE | Disposition: A | Discharge status: Home / Self Care | Payer: Self-pay | Source: Home / Self Care | Attending: General Surgery

## 2022-08-04 ENCOUNTER — Encounter (HOSPITAL_COMMUNITY): Payer: Self-pay

## 2022-08-04 ENCOUNTER — Other Ambulatory Visit: Payer: Self-pay

## 2022-08-04 DIAGNOSIS — C50911 Malignant neoplasm of unspecified site of right female breast: Secondary | ICD-10-CM

## 2022-08-04 DIAGNOSIS — Z170 Estrogen receptor positive status [ER+]: Secondary | ICD-10-CM | POA: Insufficient documentation

## 2022-08-04 DIAGNOSIS — Z79899 Other long term (current) drug therapy: Secondary | ICD-10-CM | POA: Diagnosis not present

## 2022-08-04 DIAGNOSIS — Z803 Family history of malignant neoplasm of breast: Secondary | ICD-10-CM | POA: Insufficient documentation

## 2022-08-04 DIAGNOSIS — E039 Hypothyroidism, unspecified: Secondary | ICD-10-CM | POA: Insufficient documentation

## 2022-08-04 DIAGNOSIS — I10 Essential (primary) hypertension: Secondary | ICD-10-CM | POA: Insufficient documentation

## 2022-08-04 DIAGNOSIS — Z01818 Encounter for other preprocedural examination: Secondary | ICD-10-CM

## 2022-08-04 DIAGNOSIS — C50411 Malignant neoplasm of upper-outer quadrant of right female breast: Secondary | ICD-10-CM | POA: Insufficient documentation

## 2022-08-04 HISTORY — PX: BREAST LUMPECTOMY WITH RADIOACTIVE SEED LOCALIZATION: SHX6424

## 2022-08-04 SURGERY — BREAST LUMPECTOMY WITH RADIOACTIVE SEED LOCALIZATION
Anesthesia: General | Site: Breast | Laterality: Right

## 2022-08-04 MED ORDER — ONDANSETRON HCL 4 MG/2ML IJ SOLN
INTRAMUSCULAR | Status: DC | PRN
  Administered 2022-08-04: 4 mg via INTRAVENOUS

## 2022-08-04 MED ORDER — CEFAZOLIN SODIUM-DEXTROSE 2-4 GM/100ML-% IV SOLN
2.0000 g | INTRAVENOUS | Status: AC
  Administered 2022-08-04: 2 g via INTRAVENOUS

## 2022-08-04 MED ORDER — EPHEDRINE SULFATE (PRESSORS) 50 MG/ML IJ SOLN
INTRAMUSCULAR | Status: DC | PRN
  Administered 2022-08-04: 10 mg via INTRAVENOUS
  Administered 2022-08-04: 5 mg via INTRAVENOUS
  Administered 2022-08-04: 10 mg via INTRAVENOUS

## 2022-08-04 MED ORDER — BUPIVACAINE HCL (PF) 0.25 % IJ SOLN
INTRAMUSCULAR | Status: AC
  Filled 2022-08-04: qty 180

## 2022-08-04 MED ORDER — CHLORHEXIDINE GLUCONATE CLOTH 2 % EX PADS: 6.0000 | MEDICATED_PAD | Freq: Once | CUTANEOUS | Status: DC

## 2022-08-04 MED ORDER — ONDANSETRON HCL 4 MG/2ML IJ SOLN
INTRAMUSCULAR | Status: AC
  Filled 2022-08-04: qty 2

## 2022-08-04 MED ORDER — OXYCODONE HCL 5 MG/5ML PO SOLN: 5.0000 mg | Freq: Once | ORAL | Status: DC | PRN

## 2022-08-04 MED ORDER — ACETAMINOPHEN 500 MG PO TABS
1000.0000 mg | ORAL_TABLET | ORAL | Status: AC
  Administered 2022-08-04: 1000 mg via ORAL

## 2022-08-04 MED ORDER — BUPIVACAINE HCL (PF) 0.25 % IJ SOLN
INTRAMUSCULAR | Status: DC | PRN
  Administered 2022-08-04: 10 mL

## 2022-08-04 MED ORDER — TRAMADOL HCL 50 MG PO TABS: 50.0000 mg | ORAL_TABLET | Freq: Four times a day (QID) | ORAL | 0 refills | Status: DC | PRN

## 2022-08-04 MED ORDER — LACTATED RINGERS IV SOLN: INTRAVENOUS | Status: DC

## 2022-08-04 MED ORDER — FENTANYL CITRATE (PF) 100 MCG/2ML IJ SOLN
INTRAMUSCULAR | Status: AC
  Filled 2022-08-04: qty 2

## 2022-08-04 MED ORDER — CEFAZOLIN SODIUM-DEXTROSE 2-4 GM/100ML-% IV SOLN
INTRAVENOUS | Status: AC
  Filled 2022-08-04: qty 100

## 2022-08-04 MED ORDER — FENTANYL CITRATE (PF) 100 MCG/2ML IJ SOLN: 25.0000 ug | INTRAMUSCULAR | Status: DC | PRN

## 2022-08-04 MED ORDER — LIDOCAINE HCL (CARDIAC) PF 100 MG/5ML IV SOSY
PREFILLED_SYRINGE | INTRAVENOUS | Status: DC | PRN
  Administered 2022-08-04: 60 mg via INTRATRACHEAL

## 2022-08-04 MED ORDER — ONDANSETRON HCL 4 MG/2ML IJ SOLN: 4.0000 mg | Freq: Once | INTRAMUSCULAR | Status: DC | PRN

## 2022-08-04 MED ORDER — OXYCODONE HCL 5 MG PO TABS: 5.0000 mg | ORAL_TABLET | Freq: Once | ORAL | Status: DC | PRN

## 2022-08-04 MED ORDER — FENTANYL CITRATE (PF) 100 MCG/2ML IJ SOLN
INTRAMUSCULAR | Status: DC | PRN
  Administered 2022-08-04: 50 ug via INTRAVENOUS

## 2022-08-04 MED ORDER — PROPOFOL 10 MG/ML IV BOLUS
INTRAVENOUS | Status: DC | PRN
  Administered 2022-08-04: 130 mg via INTRAVENOUS

## 2022-08-04 MED ORDER — PROPOFOL 10 MG/ML IV BOLUS
INTRAVENOUS | Status: AC
  Filled 2022-08-04: qty 20

## 2022-08-04 MED ORDER — DEXAMETHASONE SODIUM PHOSPHATE 10 MG/ML IJ SOLN
INTRAMUSCULAR | Status: DC | PRN
  Administered 2022-08-04: 5 mg via INTRAVENOUS

## 2022-08-04 MED ORDER — ACETAMINOPHEN 500 MG PO TABS
ORAL_TABLET | ORAL | Status: AC
  Filled 2022-08-04: qty 2

## 2022-08-04 SURGICAL SUPPLY — 50 items
ADH SKN CLS APL DERMABOND .7 (GAUZE/BANDAGES/DRESSINGS) ×1
APL PRP STRL LF DISP 70% ISPRP (MISCELLANEOUS) ×1
APPLIER CLIP 9.375 MED OPEN (MISCELLANEOUS)
APR CLP MED 9.3 20 MLT OPN (MISCELLANEOUS)
BINDER BREAST XLRG (GAUZE/BANDAGES/DRESSINGS) IMPLANT
BLADE SURG 15 STRL LF DISP TIS (BLADE) ×1 IMPLANT
BLADE SURG 15 STRL SS (BLADE) ×1
CANISTER SUC SOCK COL 7IN (MISCELLANEOUS) IMPLANT
CANISTER SUCT 1200ML W/VALVE (MISCELLANEOUS) IMPLANT
CHLORAPREP W/TINT 26 (MISCELLANEOUS) ×1 IMPLANT
CLIP APPLIE 9.375 MED OPEN (MISCELLANEOUS) IMPLANT
CLIP TI WIDE RED SMALL 6 (CLIP) IMPLANT
COVER BACK TABLE 60X90IN (DRAPES) ×1 IMPLANT
COVER MAYO STAND STRL (DRAPES) ×1 IMPLANT
COVER PROBE CYLINDRICAL 5X96 (MISCELLANEOUS) ×1 IMPLANT
DERMABOND ADVANCED .7 DNX12 (GAUZE/BANDAGES/DRESSINGS) ×1 IMPLANT
DRAPE LAPAROSCOPIC ABDOMINAL (DRAPES) ×1 IMPLANT
DRAPE UTILITY XL STRL (DRAPES) ×1 IMPLANT
ELECT COATED BLADE 2.86 ST (ELECTRODE) ×1 IMPLANT
ELECT REM PT RETURN 9FT ADLT (ELECTROSURGICAL) ×1
ELECTRODE REM PT RTRN 9FT ADLT (ELECTROSURGICAL) ×1 IMPLANT
GLOVE BIO SURGEON STRL SZ 6.5 (GLOVE) IMPLANT
GLOVE BIO SURGEON STRL SZ7 (GLOVE) ×2 IMPLANT
GLOVE BIOGEL PI IND STRL 7.5 (GLOVE) ×1 IMPLANT
GOWN STRL REUS W/ TWL LRG LVL3 (GOWN DISPOSABLE) ×2 IMPLANT
GOWN STRL REUS W/TWL LRG LVL3 (GOWN DISPOSABLE) ×2
KIT MARKER MARGIN INK (KITS) ×1 IMPLANT
NDL HYPO 25X1 1.5 SAFETY (NEEDLE) ×1 IMPLANT
NEEDLE HYPO 25X1 1.5 SAFETY (NEEDLE) ×1 IMPLANT
NS IRRIG 1000ML POUR BTL (IV SOLUTION) IMPLANT
PACK BASIN DAY SURGERY FS (CUSTOM PROCEDURE TRAY) ×1 IMPLANT
PENCIL SMOKE EVACUATOR (MISCELLANEOUS) ×1 IMPLANT
RETRACTOR ONETRAX LX 90X20 (MISCELLANEOUS) IMPLANT
SLEEVE SCD COMPRESS KNEE MED (STOCKING) ×1 IMPLANT
SPIKE FLUID TRANSFER (MISCELLANEOUS) IMPLANT
SPONGE T-LAP 4X18 ~~LOC~~+RFID (SPONGE) ×1 IMPLANT
STRIP CLOSURE SKIN 1/2X4 (GAUZE/BANDAGES/DRESSINGS) ×1 IMPLANT
SUT MNCRL AB 4-0 PS2 18 (SUTURE) ×1 IMPLANT
SUT MON AB 5-0 PS2 18 (SUTURE) IMPLANT
SUT SILK 2 0 SH (SUTURE) IMPLANT
SUT VIC AB 2-0 SH 27 (SUTURE) ×2
SUT VIC AB 2-0 SH 27XBRD (SUTURE) ×1 IMPLANT
SUT VIC AB 3-0 SH 27 (SUTURE) ×1
SUT VIC AB 3-0 SH 27X BRD (SUTURE) ×1 IMPLANT
SUT VIC AB 5-0 PS2 18 (SUTURE) IMPLANT
SYR CONTROL 10ML LL (SYRINGE) ×1 IMPLANT
TOWEL GREEN STERILE FF (TOWEL DISPOSABLE) ×1 IMPLANT
TRAY FAXITRON CT DISP (TRAY / TRAY PROCEDURE) ×1 IMPLANT
TUBE CONNECTING 20X1/4 (TUBING) IMPLANT
YANKAUER SUCT BULB TIP NO VENT (SUCTIONS) IMPLANT

## 2022-08-04 NOTE — Discharge Instructions (Addendum)
[place] Office Phone Number [phone]  POST OP INSTRUCTIONS Take 400 mg of ibuprofen every 8 hours or 650 mg tylenol every 6 hours for next 72 hours then as needed. Use ice several times daily also.  A prescription for pain medication may be given to you upon discharge.  Take your pain medication as prescribed, if needed.  If narcotic pain medicine is not needed, then you may take acetaminophen (Tylenol), naprosyn (Alleve) or ibuprofen (Advil) as needed. Take your usually prescribed medications unless otherwise directed If you need a refill on your pain medication, please contact your pharmacy.  They will contact our office to request authorization.  Prescriptions will not be filled after 5pm or on week-ends. You should eat very light the first 24 hours after surgery, such as soup, crackers, pudding, etc.  Resume your normal diet the day after surgery. Most patients will experience some swelling and bruising in the breast.  Ice packs and a good support bra will help.  Wear the breast binder provided or a sports bra for 72 hours day and night.  After that wear a sports bra during the day until you return to the office. Swelling and bruising can take several days to resolve.  It is common to experience some constipation if taking pain medication after surgery.  Increasing fluid intake and taking a stool softener will usually help or prevent this problem from occurring.  A mild laxative (Milk of Magnesia or Miralax) should be taken according to package directions if there are no bowel movements after 48 hours. I used skin glue on the incision, you may shower in 24 hours.  The glue will flake off over the next 2-3 weeks.  Any sutures or staples will be removed at the office during your follow-up visit. ACTIVITIES:  You may resume regular daily activities (gradually increasing) beginning the next day.  Wearing a good support bra or sports bra minimizes pain and swelling.  You may have  sexual intercourse when it is comfortable. You may drive when you no longer are taking prescription pain medication, you can comfortably wear a seatbelt, and you can safely maneuver your car and apply brakes. RETURN TO WORK:  ______________________________________________________________________________________ Dennis Bast should see your doctor in the office for a follow-up appointment approximately two weeks after your surgery.  Your doctor's nurse will typically make your follow-up appointment when she calls you with your pathology report.  Expect your pathology report 3-4 business days after your surgery.  You may call to check if you do not hear from [place] after three days. OTHER INSTRUCTIONS: _______________________________________________________________________________________________ _____________________________________________________________________________________________________________________________________ _____________________________________________________________________________________________________________________________________ _____________________________________________________________________________________________________________________________________  WHEN TO CALL DR [place]: Fever over 101.0 Nausea and/or vomiting. Extreme swelling or bruising. Continued bleeding from incision. Increased pain, redness, or drainage from the incision.  The clinic staff is available to answer your questions during regular business hours.  Please don't hesitate to call and ask to speak to one of the nurses for clinical concerns.  If you have a medical emergency, go to the nearest emergency room or call 911.  A surgeon from [facility] is always on call at the hospital.  For further questions, please visit [email or web address]   Post Anesthesia Home Care Instructions  Activity: Get plenty of rest for the remainder of the day. A responsible individual must stay  with you for 24 hours following the procedure.  For the next 24 hours, DO NOT: -Drive a car -Paediatric nurse -Drink alcoholic beverages -Take any medication unless instructed by  your physician -Make any legal decisions or sign important papers.  Meals: Start with liquid foods such as gelatin or soup. Progress to regular foods as tolerated. Avoid greasy, spicy, heavy foods. If nausea and/or vomiting occur, drink only clear liquids until the nausea and/or vomiting subsides. Call your physician if vomiting continues.  Special Instructions/Symptoms: Your throat may feel dry or sore from the anesthesia or the breathing tube placed in your throat during surgery. If this causes discomfort, gargle with warm salt water. The discomfort should disappear within 24 hours.  Tylenol can be taken after 1:10 p

## 2022-08-04 NOTE — H&P (Signed)
77 year old otherwise fairly healthy female who noted a mass in her right upper outer quadrant. She underwent evaluation that showed by mammogram and ultrasound this to be a 1.8 cm mass on mammogram. On ultrasound it was a 1.4 x 1.4 x 1.6 cm mass. She has no nipple discharge. Her last mammogram was in 2018. She has B density breast tissue. She had 1 abnormal node with cortical thickening that was biopsied and is negative concordant. 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  Review of Systems: A complete review of systems was obtained from the patient. I have reviewed this information and discussed as appropriate with the patient. See HPI as well for other ROS.  Review of Systems  All other systems reviewed and are negative.   Medical History: Past Medical History:  Diagnosis Date  Anemia  Arthritis  Asthma, unspecified asthma severity, unspecified whether complicated, unspecified whether persistent  Thyroid disease   Patient Active Problem List  Diagnosis  Malignant neoplasm of upper-outer quadrant of right breast in female, estrogen receptor positive   History reviewed. No pertinent surgical history.   No Known Allergies  Current Outpatient Medications on File Prior to Visit  Medication Sig Dispense Refill  amLODIPine (NORVASC) 5 MG tablet Take 5 mg by mouth once daily  atenoloL (TENORMIN) 50 MG tablet TAKE 1 TABLET BY MOUTH ONCE DAILY AS NEEDED FOR TACHYCARDIA  doxycycline (VIBRAMYCIN) 100 MG capsule Take 100 mg by mouth 2 (two) times daily  ezetimibe (ZETIA) 10 mg tablet Take 1 tablet by mouth once daily  levothyroxine (SYNTHROID) 75 MCG tablet Take 75 mcg by mouth once daily  rosuvastatin (CRESTOR) 40 MG tablet Take 40 mg by mouth once daily   Family History  Problem Relation Age of Onset  Breast cancer Daughter   Social History   Tobacco Use  Smoking Status Never   Smokeless Tobacco Never  Marital status: Married  Tobacco Use  Smoking status: Never  Smokeless tobacco: Never  Substance and Sexual Activity  Alcohol use: Not Currently  Drug use: Not Currently   Objective:  Physical Exam Vitals reviewed.  Constitutional:  Appearance: Normal appearance.  Chest:  Breasts: Right: No inverted nipple, mass or nipple discharge.  Left: No inverted nipple, mass or nipple discharge.  Comments: Hematoma ruoq Lymphadenopathy:  Upper Body:  Right upper body: No supraclavicular or axillary adenopathy.  Left upper body: No supraclavicular or axillary adenopathy.  Neurological:  Mental Status: She is alert.    Assessment and Plan:   Malignant neoplasm of upper-outer quadrant of right breast in female, estrogen receptor positive   Right breast seed guided lumpectomy  She would like to do this in January after holidays which I think is reasonable.   We discussed the staging and pathophysiology of breast cancer. We discussed all of the different options for treatment for breast cancer including surgery, chemotherapy, radiation therapy, Herceptin, and antiestrogen therapy.  I dont think she needs a node biopsy due to choosing wisely guidelines and SOUND trial.   We discussed the options for treatment of the breast cancer which included lumpectomy versus a mastectomy. We discussed the performance of the lumpectomy with radioactive seed placement. We discussed a 5-10% chance of a positive margin requiring reexcision in the operating room. We also discussed that she might need radiation therapy if she undergoes lumpectomy. We discussed mastectomy and the postoperative care for that as well.  Mastectomy can be followed by reconstruction. The decision for lumpectomy vs mastectomy has no impact on decision for chemotherapy. Most mastectomy patients will not need radiation therapy. We discussed that there is no difference in her survival whether she undergoes  lumpectomy with radiation therapy or antiestrogen therapy versus a mastectomy. There is also no real difference between her recurrence in the breast.  We discussed the risks of operation including bleeding, infection, possible reoperation. She understands her further therapy will be based on what her stages at the time of her operation

## 2022-08-04 NOTE — Interval H&P Note (Signed)
History and Physical Interval Note:  08/04/2022 7:05 AM  Pamela Small  has presented today for surgery, with the diagnosis of RIGHT BREAST CANCER.  The various methods of treatment have been discussed with the patient and family. After consideration of risks, benefits and other options for treatment, the patient has consented to  Procedure(s): RIGHT BREAST LUMPECTOMY WITH RADIOACTIVE SEED LOCALIZATION (Right) as a surgical intervention.  The patient's history has been reviewed, patient examined, no change in status, stable for surgery.  I have reviewed the patient's chart and labs.  Questions were answered to the patient's satisfaction.     Rolm Bookbinder

## 2022-08-04 NOTE — Transfer of Care (Signed)
Immediate Anesthesia Transfer of Care Note  Patient: Pamela Small  Procedure(s) Performed: RIGHT BREAST LUMPECTOMY WITH RADIOACTIVE SEED LOCALIZATION (Right: Breast)  Patient Location: PACU  Anesthesia Type:General  Level of Consciousness: patient cooperative and pateint uncooperative  Airway & Oxygen Therapy: Patient Spontanous Breathing and Patient connected to face mask oxygen  Post-op Assessment: Report given to RN and Post -op Vital signs reviewed and stable  Post vital signs: Reviewed and stable  Last Vitals:  Vitals Value Taken Time  BP    Temp    Pulse 58 08/04/22 0826  Resp 16 08/04/22 0826  SpO2 100 % 08/04/22 0826  Vitals shown include unvalidated device data.  Last Pain:  Vitals:   08/04/22 0705  TempSrc: Oral  PainSc: 0-No pain         Complications: No notable events documented.

## 2022-08-04 NOTE — Anesthesia Postprocedure Evaluation (Signed)
Anesthesia Post Note  Patient: [name] Form  Procedure(s) Performed: RIGHT BREAST LUMPECTOMY WITH RADIOACTIVE SEED LOCALIZATION (Right: Breast)     Patient location during evaluation: PACU Anesthesia Type: General Level of consciousness: awake and alert Pain management: pain level controlled Vital Signs Assessment: post-procedure vital signs reviewed and stable Respiratory status: spontaneous breathing, nonlabored ventilation and respiratory function stable Cardiovascular status: stable and blood pressure returned to baseline Anesthetic complications: no   No notable events documented.  Last Vitals:  Vitals:   08/04/22 0842 08/04/22 0903  BP: (!) 151/67 124/65  Pulse: 63 73  Resp: 17 16  Temp:  36.6 C  SpO2: 97% 97%    Last Pain:  Vitals:   08/04/22 0903  TempSrc: Oral  PainSc: 0-No pain                 Pamela Small

## 2022-08-04 NOTE — Anesthesia Procedure Notes (Signed)
Procedure Name: LMA Insertion Date/Time: 08/04/2022 7:32 AM  Performed by: Glory Buff, CRNAPre-anesthesia Checklist: Patient identified, Emergency Drugs available, Suction available and Patient being monitored Patient Re-evaluated:Patient Re-evaluated prior to induction Oxygen Delivery Method: Circle system utilized Preoxygenation: Pre-oxygenation with 100% oxygen Induction Type: IV induction LMA: LMA inserted LMA Size: 4.0 Number of attempts: 1 Placement Confirmation: CO2 detector Tube secured with: Tape Dental Injury: Teeth and Oropharynx as per pre-operative assessment

## 2022-08-04 NOTE — Op Note (Signed)
Preoperative diagnosis: Clinical stage I right breast cancer Postoperative diagnosis: Same as above Procedure: Right breast radioactive seed guided lumpectomy Surgeon: Dr. Serita Grammes Anesthesia: General Estimated blood loss: Minimal Complications: None Drains: None Specimens: 1.  Right breast tissue marked with paint containing seed and 2 clips 2.  Additional inferior, lateral, posterior margins marked short superior, long lateral, double deep Sponge needle count was correct completion Disposition to recovery stable addition  Indications:77 year old otherwise fairly healthy female who noted a mass in her right upper outer quadrant. She underwent evaluation that showed by mammogram and ultrasound this to be a 1.8 cm mass on mammogram. On ultrasound it was a 1.4 x 1.4 x 1.6 cm mass. Her last mammogram was in 2018. She has B density breast tissue. She had 1 abnormal node with cortical thickening that was biopsied and is negative concordant. 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  We discussed proceeding with lumpectomy.  Procedure: After informed consent was obtained the patient was taken to the operative room.  She had a radioactive seed placed and had these mammograms available.  She was given antibiotics.  SCDs were placed.  She was placed under general anesthesia without complication.  She was prepped and draped in standard sterile surgical fashion.  Surgical timeout was then performed.  I located the seed in the upper outer quadrant.  I made an incision in the upper outer quadrant after for treating this with Marcaine.  I dissected to the seed.  I used the neoprobe to guide excision of the seed at the palpable mass in the surrounding tissue and attempt again a clear margin.  I then did a 3D image of this.  I thought I might be close and several margins so I removed these.  The posterior margin  is now the muscle.  I then placed clips in the cavity.  I then closed the cavity with 2-0 Vicryl sutures.  The skin was closed with 3-0 Vicryl and 4 Monocryl.  Glue and Steri-Strips were applied.  She tolerated this well was extubated transferred to recovery stable.

## 2022-08-05 ENCOUNTER — Encounter (HOSPITAL_BASED_OUTPATIENT_CLINIC_OR_DEPARTMENT_OTHER): Payer: Self-pay | Admitting: General Surgery

## 2022-08-08 LAB — SURGICAL PATHOLOGY

## 2022-08-10 ENCOUNTER — Encounter: Payer: Self-pay | Admitting: *Deleted

## 2022-08-10 ENCOUNTER — Telehealth: Payer: Self-pay | Admitting: *Deleted

## 2022-08-10 NOTE — Telephone Encounter (Signed)
Received order for oncotype testing. Requisition faxed to pathology 

## 2022-08-18 ENCOUNTER — Encounter (HOSPITAL_COMMUNITY): Payer: Self-pay

## 2022-08-25 ENCOUNTER — Encounter: Payer: Self-pay | Admitting: *Deleted

## 2022-08-25 ENCOUNTER — Telehealth: Payer: Self-pay | Admitting: Radiation Oncology

## 2022-08-25 DIAGNOSIS — Z170 Estrogen receptor positive status [ER+]: Secondary | ICD-10-CM

## 2022-08-25 NOTE — Telephone Encounter (Signed)
2/8 @ 3:19 pm Left voicemail for patient to call our office to be schedule for consult with Dr. Isidore Moos.

## 2022-08-29 ENCOUNTER — Telehealth: Payer: Self-pay | Admitting: *Deleted

## 2022-08-29 ENCOUNTER — Encounter: Payer: Self-pay | Admitting: *Deleted

## 2022-08-29 ENCOUNTER — Inpatient Hospital Stay: Payer: Medicare HMO | Attending: Hematology and Oncology | Admitting: Hematology and Oncology

## 2022-08-29 NOTE — Telephone Encounter (Signed)
Received oncotype score of 21. Physician team notified. Called pt with results and discussed chemo not recommended. Next step is xrt with Dr. Dr. Isidore Moos

## 2022-08-31 ENCOUNTER — Inpatient Hospital Stay
Admission: RE | Admit: 2022-08-31 | Discharge: 2022-08-31 | Disposition: A | Discharge status: Home / Self Care | Payer: Self-pay | Source: Ambulatory Visit | Attending: Radiation Oncology | Admitting: Radiation Oncology

## 2022-08-31 ENCOUNTER — Other Ambulatory Visit: Payer: Self-pay | Admitting: Radiation Oncology

## 2022-08-31 ENCOUNTER — Ambulatory Visit
Admission: RE | Admit: 2022-08-31 | Discharge: 2022-08-31 | Disposition: A | Discharge status: Home / Self Care | Payer: Self-pay | Source: Ambulatory Visit | Attending: Radiation Oncology | Admitting: Radiation Oncology

## 2022-08-31 DIAGNOSIS — Z170 Estrogen receptor positive status [ER+]: Secondary | ICD-10-CM

## 2022-09-01 NOTE — Progress Notes (Signed)
Location of Breast Cancer:  Malignant neoplasm of upper-outer quadrant of right breast in female, estrogen receptor positive  Histology per Pathology Report:  08/04/2022 A. BREAST WITH RADIOACTIVE SEED, RIGHT, LUMPECTOMY:  - Invasive ductal carcinoma, 1.7 cm, grade 1  - Margins, invasive: Negative      Closest, invasive: 1 mm, anterior  - Lymphovascular invasion: Not identified  Prognostic markers:  ER 100%, positive, PR 0%, negative, Her2 negative  - Other: Calcifications associated with carcinoma  - Biopsy site and biopsy clip  - See oncology table  B. BREAST, RIGHT, ADDITIONAL POSTERIOR MARGIN, EXCISION:  - Benign breast tissue and skeletal muscle  - Posterior margin negative for carcinoma  C. BREAST, RIGHT, ADDITIONAL LATERAL MARGIN, EXCISION:  - Benign adipose tissue  - Lateral margin negative for carcinoma  D. BREAST, RIGHT, ADDITIONAL INFERIOR MARGIN, EXCISION:  - Benign adipose tissue  - Inferior margin negative for carcinoma   Receptor Status: ER(100%), PR (0%), Her2-neu (Negative), Ki-67(5%)  Did patient present with symptoms (if so, please note symptoms) or was this found on screening mammography?: found to have palpable lumps in both breasts during a routine wellness exam by her PCP   Past/Anticipated interventions by surgeon, if any:  08/19/2022 Dr. Rolm Bookbinder (office visit) --Assessment and Plan:  She is doing well and can return to full normal activity. We discussed the Oncotype which is been sent. We discussed the rationale for the Oncotype. I think if she ends up needing chemotherapy she likely will decline this. We did discuss antiestrogens today at length as well. She has a follow-up with oncology scheduled. We will await to see those results. At a minimum I will see her back in 1 year for a routine follow-up. --Return in about 1 year (around 08/20/2023).   08/04/2022 --Dr. Rolm Bookbinder Right breast radioactive seed guided lumpectomy    Past/Anticipated interventions by medical oncology, if any:  08/29/2022 --Bary Castilla, RN (Breast Navigator) Received oncotype score of 21. Physician team notified. Called pt with results and discussed chemo not recommended. Next step is xrt with Dr. Dr. Isidore Moos  Under care of Dr. Arletha Pili Iruku 06/05/2022 (Breast Clinic) Given small tumor and strong ER positivity, we have discussed about proceeding with upfront surgery.  We have also discussed about Oncotype testing today.  I do not believe it is likely that she will need adjuvant chemotherapy.  She at this time does not want to proceed with adjuvant radiation as well.  She would like to proceed with adjuvant antiestrogen therapy  She will return to clinic after surgery. She was hoping to skip radiation   Lymphedema issues, if any:  No is able to move without difficulty.  Pain issues, if any:   0/10  SAFETY ISSUES: Prior radiation?   No Pacemaker/ICD?  No Possible current pregnancy? No--hysterectomy Is the patient on methotrexate?  No  Current Complaints / other details:

## 2022-09-05 NOTE — Progress Notes (Signed)
Radiation Oncology         [phone] ________________________________  Name: Pamela Small MRN: [id number]  Date: 09/06/2022  DOB: May 06, 1946  Follow-Up Visit Note  Outpatient  CC: Pcp, No  Iruku, Praveena, MD  Diagnosis:   No diagnosis found.   Stage IA (cT1c, cN0, cM0) Right Breast UOQ, Invasive Ductal Carcinoma, ER+ / PR- / Her2-, Grade 1: s/p right lumpectomy   CHIEF COMPLAINT: Here to discuss management of right breast cancer  Narrative:  The patient returns today for follow-up.     Since her breast clinic consultation date of 06/15/22, the patient opted to proceed with a right breast lumpectomy without nodal biopsies on 08/04/22 under the care of Dr. Donne Hazel. Pathology from the procedure revealed: tumor size of 1.7 cm; histology of grade 1 invasive ductal carcinoma; all margins negative for invasive carcinoma; margin status to invasive disease of 1 mm from the anterior margin; no lymph nodes were examined;  ER status: 100% positive with strong staining intensity; PR status negative; Proliferation marker Ki67 at 5%; Her2 status negative; Grade 1.  Oncotype DX was obtained on the final surgical sample and the recurrence score of 21 predicts a risk of recurrence outside the breast over the next 9 years of 7%, if the patient's only systemic therapy is an antiestrogen for 5 years.  It also predicts no significant benefit from chemotherapy.  Based on negative Oncotype results, the patient will return to Dr. Chryl Heck in the near future to discuss the role of antiestrogen therapy.   The patient declined genetics. No imaging imaging was performed in the interval.   Symptomatically, the patient reports: ***        ALLERGIES:  is allergic to guaifenesin.  Meds: Current Outpatient Medications  Medication Sig Dispense Refill   acetaminophen (TYLENOL) 500 MG tablet Take by mouth.     albuterol (VENTOLIN HFA) 108 (90 Base) MCG/ACT inhaler Inhale into the lungs.     amLODipine  (NORVASC) 5 MG tablet Take 1 tablet (5 mg total) by mouth daily. 30 tablet 1   atenolol (TENORMIN) 50 MG tablet Take by mouth.     Cholecalciferol 50 MCG (2000 UT) TABS Take by mouth.     cyanocobalamin (VITAMIN B12) 1000 MCG tablet Take by mouth.     ezetimibe (ZETIA) 10 MG tablet Take 1 tablet (10 mg total) by mouth daily. 90 tablet 0   levothyroxine (SYNTHROID) 50 MCG tablet Take 1 tablet (50 mcg total) by mouth daily before breakfast. (Patient taking differently: Take 75 mcg by mouth daily before breakfast.) 90 tablet 0   Multiple Vitamin (DAILY VITES) tablet Take 1 tablet by mouth at bedtime.     propylthiouracil (PTU) 50 MG tablet 1/2 tab every other day     rosuvastatin (CRESTOR) 20 MG tablet Take 1 tablet (20 mg total) by mouth daily. 90 tablet 0   solifenacin (VESICARE) 5 MG tablet Take by mouth.     traMADol (ULTRAM) 50 MG tablet Take 1 tablet (50 mg total) by mouth every 6 (six) hours as needed. 10 tablet 0   No current facility-administered medications for this encounter.    Physical Findings:  vitals were not taken for this visit. .     General: Alert and oriented, in no acute distress HEENT: Head is normocephalic. Extraocular movements are intact. Oropharynx is clear. Neck: Neck is supple, no palpable cervical or supraclavicular lymphadenopathy. Heart: Regular in rate and rhythm with no murmurs, rubs, or gallops. Chest: Clear to auscultation  bilaterally, with no rhonchi, wheezes, or rales. Abdomen: Soft, nontender, nondistended, with no rigidity or guarding. Extremities: No cyanosis or edema. Lymphatics: see Neck Exam Musculoskeletal: symmetric strength and muscle tone throughout. Neurologic: No obvious focalities. Speech is fluent.  Psychiatric: Judgment and insight are intact. Affect is appropriate. Breast exam reveals ***  Lab Findings: Lab Results  Component Value Date   WBC 5.2 06/15/2022   HGB 15.0 06/15/2022   HCT 46.3 (H) 06/15/2022   MCV 88.9 06/15/2022    PLT 265 06/15/2022    @LASTCHEMISTRY$ @  Radiographic Findings: No results found.  Impression/Plan: We discussed adjuvant radiotherapy today.  I recommend *** in order to ***.  I reviewed the logistics, benefits, risks, and potential side effects of this treatment in detail. Risks may include but not necessary be limited to acute and late injury tissue in the radiation fields such as skin irritation (change in color/pigmentation, itching, dryness, pain, peeling). She may experience fatigue. We also discussed possible risk of long term cosmetic changes or scar tissue. There is also a smaller risk for lung toxicity, ***cardiac toxicity, ***brachial plexopathy, ***lymphedema, ***musculoskeletal changes, ***rib fragility or ***induction of a second malignancy, ***late chronic non-healing soft tissue wound.    The patient asked good questions which I answered to her satisfaction. She is enthusiastic about proceeding with treatment. A consent form has been *** signed and placed in her chart.  A total of *** medically necessary complex treatment devices will be fabricated and supervised by me: *** fields with MLCs for custom blocks to protect heart, and lungs;  and, a Vac-lok. MORE COMPLEX DEVICES MAY BE MADE IN DOSIMETRY FOR FIELD IN FIELD BEAMS FOR DOSE HOMOGENEITY.  I have requested : 3D Simulation which is medically necessary to give adequate dose to at risk tissues while sparing lungs and heart.  I have requested a DVH of the following structures: lungs, heart, *** lumpectomy cavity.    The patient will receive *** Gy in *** fractions to the *** with *** fields.  This will be *** followed by a boost.  On date of service, in total, I spent *** minutes on this encounter. Patient was seen in person.  _____________________________________   Eppie Gibson, MD  This document serves as a record of services personally performed by Eppie Gibson, MD. It was created on her behalf by Roney Mans, a trained  medical scribe. The creation of this record is based on the scribe's personal observations and the provider's statements to them. This document has been checked and approved by the attending provider.

## 2022-09-06 ENCOUNTER — Encounter: Payer: Self-pay | Admitting: Radiation Oncology

## 2022-09-06 ENCOUNTER — Ambulatory Visit
Admission: RE | Admit: 2022-09-06 | Discharge: 2022-09-06 | Disposition: A | Discharge status: Home / Self Care | Payer: Medicare HMO | Source: Ambulatory Visit | Attending: Radiation Oncology | Admitting: Radiation Oncology

## 2022-09-06 ENCOUNTER — Ambulatory Visit: Payer: Medicare HMO | Admitting: Radiation Oncology

## 2022-09-06 VITALS — BP 149/84 | HR 66 | Temp 96.2°F | Resp 18 | Ht 62.0 in | Wt 162.4 lb

## 2022-09-06 DIAGNOSIS — Z170 Estrogen receptor positive status [ER+]: Secondary | ICD-10-CM | POA: Insufficient documentation

## 2022-09-06 DIAGNOSIS — C50411 Malignant neoplasm of upper-outer quadrant of right female breast: Secondary | ICD-10-CM | POA: Insufficient documentation

## 2022-09-06 DIAGNOSIS — Z79890 Hormone replacement therapy (postmenopausal): Secondary | ICD-10-CM | POA: Diagnosis not present

## 2022-09-06 DIAGNOSIS — Z79899 Other long term (current) drug therapy: Secondary | ICD-10-CM | POA: Insufficient documentation

## 2022-09-07 ENCOUNTER — Telehealth: Payer: Self-pay | Admitting: Hematology and Oncology

## 2022-09-07 NOTE — Telephone Encounter (Signed)
Left patient a vm regarding upcoming appointment

## 2022-09-13 ENCOUNTER — Encounter: Payer: Self-pay | Admitting: *Deleted

## 2022-09-13 DIAGNOSIS — Z170 Estrogen receptor positive status [ER+]: Secondary | ICD-10-CM

## 2022-09-15 ENCOUNTER — Encounter: Payer: Self-pay | Admitting: *Deleted

## 2022-09-19 ENCOUNTER — Encounter: Payer: Self-pay | Admitting: *Deleted

## 2022-09-19 NOTE — Progress Notes (Signed)
Called pt to inquire about cancelled appt with Dr. Chryl Heck for tomorrow. Pt said she didn't know anything about that, then she hung up on me. Tried to attempt to call pt back no answer.

## 2022-09-20 ENCOUNTER — Ambulatory Visit: Payer: Medicare HMO | Admitting: Hematology and Oncology

## 2022-09-20 ENCOUNTER — Inpatient Hospital Stay: Payer: Medicare HMO | Attending: Hematology and Oncology | Admitting: Hematology and Oncology

## 2022-10-26 ENCOUNTER — Other Ambulatory Visit: Payer: Self-pay | Admitting: Family Medicine

## 2022-10-31 ENCOUNTER — Encounter: Payer: Self-pay | Admitting: *Deleted

## 2022-12-07 ENCOUNTER — Other Ambulatory Visit: Payer: Self-pay | Admitting: *Deleted

## 2022-12-07 DIAGNOSIS — M79604 Pain in right leg: Secondary | ICD-10-CM

## 2022-12-19 ENCOUNTER — Ambulatory Visit (HOSPITAL_COMMUNITY)
Admission: RE | Admit: 2022-12-19 | Discharge: 2022-12-19 | Disposition: A | Discharge status: Home / Self Care | Payer: Medicare HMO | Source: Ambulatory Visit | Attending: Surgery | Admitting: Surgery

## 2022-12-19 DIAGNOSIS — M79604 Pain in right leg: Secondary | ICD-10-CM

## 2022-12-19 DIAGNOSIS — M79605 Pain in left leg: Secondary | ICD-10-CM | POA: Diagnosis present

## 2022-12-19 LAB — VAS US ABI WITH/WO TBI
Left ABI: 1.01
Right ABI: 1.11

## 2022-12-20 ENCOUNTER — Ambulatory Visit: Payer: Medicare HMO | Admitting: Vascular Surgery

## 2022-12-20 ENCOUNTER — Encounter: Payer: Self-pay | Admitting: Vascular Surgery

## 2022-12-20 VITALS — BP 127/79 | HR 72 | Temp 97.7°F | Resp 16 | Ht 62.5 in | Wt 162.0 lb

## 2022-12-20 DIAGNOSIS — M79674 Pain in right toe(s): Secondary | ICD-10-CM | POA: Diagnosis not present

## 2022-12-20 DIAGNOSIS — M79676 Pain in unspecified toe(s): Secondary | ICD-10-CM | POA: Insufficient documentation

## 2022-12-20 NOTE — Progress Notes (Signed)
Patient name: Pamela Small MRN: [id number] DOB: 04-23-1946 Sex: female  REASON FOR CONSULT: Evaluate for PAD  HPI: REVE Small is a 77 y.o. female, with history of hypertension and hyperlipidemia that presents for evaluation of PAD.  She describes mostly pain in the right toe.  She has had previous bunion surgery in the right foot.  She does not get any classic cramping in the calf or thigh with walking.  She does have right knee pain with prolonged ambulation.  No tissue loss.  No prior vascular inventions.  Does not smoke.  Past Medical History:  Diagnosis Date   Asthma    Hyperlipidemia    Hypertension    Lung nodules    seen on CT scan 2013   Multiple thyroid nodules    US guided bx was benign in 2013    Past Surgical History:  Procedure Laterality Date   ABDOMINAL HYSTERECTOMY     BREAST LUMPECTOMY WITH RADIOACTIVE SEED LOCALIZATION Right 08/04/2022   Procedure: RIGHT BREAST LUMPECTOMY WITH RADIOACTIVE SEED LOCALIZATION;  Surgeon: Emelia Loron, MD;  Location: [facility];  Service: General;  Laterality: Right;   COLONOSCOPY     FOOT ARTHRODESIS, MODIFIED MCBRIDE     POLYPECTOMY      Family History  Problem Relation Age of Onset   Breast cancer Daughter    Hyperlipidemia Mother    Asthma Mother    Diabetes Maternal Grandmother     SOCIAL HISTORY: Social History   Socioeconomic History   Marital status: Married    Spouse name: Not on file   Number of children: Not on file   Years of education: Not on file   Highest education level: Not on file  Occupational History   Not on file  Tobacco Use   Smoking status: Never   Smokeless tobacco: Never  Substance and Sexual Activity   Alcohol use: No   Drug use: No   Sexual activity: Never  Other Topics Concern   Not on file  Social History Narrative   Not on file   Social Determinants of Health   Financial Resource Strain: Low Risk  (06/15/2022)   Overall Financial Resource Strain  (CARDIA)    Difficulty of Paying Living Expenses: Not very hard  Food Insecurity: No Food Insecurity (09/06/2022)   Hunger Vital Sign    Worried About Running Out of Food in the Last Year: Never true    Ran Out of Food in the Last Year: Never true  Transportation Needs: No Transportation Needs (09/06/2022)   PRAPARE - Administrator, Civil Service (Medical): No    Lack of Transportation (Non-Medical): No  Physical Activity: Not on file  Stress: Not on file  Social Connections: Not on file  Intimate Partner Violence: Not At Risk (09/06/2022)   Humiliation, Afraid, Rape, and Kick questionnaire    Fear of Current or Ex-Partner: No    Emotionally Abused: No    Physically Abused: No    Sexually Abused: No    Allergies  Allergen Reactions   Guaifenesin Nausea And Vomiting    Current Outpatient Medications  Medication Sig Dispense Refill   acetaminophen (TYLENOL) 500 MG tablet Take by mouth.     albuterol (VENTOLIN HFA) 108 (90 Base) MCG/ACT inhaler Inhale into the lungs.     amLODipine (NORVASC) 5 MG tablet Take 1 tablet (5 mg total) by mouth daily. 30 tablet 1   atenolol (TENORMIN) 50 MG tablet Take by mouth.  B Complex-C (B-COMPLEX WITH VITAMIN C) tablet Take by mouth.     Cholecalciferol 50 MCG (2000 UT) TABS Take by mouth.     cyanocobalamin (VITAMIN B12) 1000 MCG tablet Take by mouth.     ezetimibe (ZETIA) 10 MG tablet Take 1 tablet by mouth once daily 90 tablet 0   levothyroxine (SYNTHROID) 75 MCG tablet Take 75 mcg by mouth daily.     Multiple Vitamin (DAILY VITES) tablet Take 1 tablet by mouth at bedtime.     propylthiouracil (PTU) 50 MG tablet 1/2 tab every other day     rosuvastatin (CRESTOR) 40 MG tablet Take 40 mg by mouth daily.     solifenacin (VESICARE) 5 MG tablet Take by mouth.     traMADol (ULTRAM) 50 MG tablet Take 1 tablet (50 mg total) by mouth every 6 (six) hours as needed. 10 tablet 0   No current facility-administered medications for this visit.     REVIEW OF SYSTEMS:  [X]  denotes positive finding, [ ]  denotes negative finding Cardiac  Comments:  Chest pain or chest pressure:    Shortness of breath upon exertion:    Short of breath when lying flat:    Irregular heart rhythm:        Vascular    Pain in calf, thigh, or hip brought on by ambulation:    Pain in feet at night that wakes you up from your sleep:     Blood clot in your veins:    Leg swelling:         Pulmonary    Oxygen at home:    Productive cough:     Wheezing:         Neurologic    Sudden weakness in arms or legs:     Sudden numbness in arms or legs:     Sudden onset of difficulty speaking or slurred speech:    Temporary loss of vision in one eye:     Problems with dizziness:         Gastrointestinal    Blood in stool:     Vomited blood:         Genitourinary    Burning when urinating:     Blood in urine:        Psychiatric    Major depression:         Hematologic    Bleeding problems:    Problems with blood clotting too easily:        Skin    Rashes or ulcers:        Constitutional    Fever or chills:      PHYSICAL EXAM: Vitals:   12/20/22 1035  BP: 127/79  Pulse: 72  Resp: 16  Temp: 97.7 F (36.5 C)  TempSrc: Temporal  SpO2: 96%  Weight: 162 lb (73.5 kg)  Height: 5' 2.5" (1.588 m)    GENERAL: The patient is a well-nourished female, in no acute distress. The vital signs are documented above. CARDIAC: There is a regular rate and rhythm.  VASCULAR:  Bilateral femoral pulses palpable Bilateral DP pulses palpable PULMONARY: No respiratory distress ABDOMEN: Soft and non-tender. MUSCULOSKELETAL: There are no major deformities or cyanosis. NEUROLOGIC: No focal weakness or paresthesias are detected. SKIN: There are no ulcers or rashes noted. PSYCHIATRIC: The patient has a normal affect.  DATA:   ABI's 1.1 right multiphasic and 1.01 left multiphasic - both normal  Assessment/Plan:  77 y.o. female, with history of  hypertension and hyperlipidemia that  presents for evaluation of PAD.  I discussed that she has normal ABIs greater than 1 at the ankle with multiphasic waveforms at the ankle.  She has palpable pedal pulses on exam.  She has no classic claudication symptoms and certainly has no evidence of critical limb ischemia like rest pain or tissue loss.  I do not think she has any significant PAD or evidence of arterial insuffiencey.  He can follow with me as needed.   Cephus Shelling, MD [facility] Office: [phone]

## 2023-02-01 ENCOUNTER — Other Ambulatory Visit (HOSPITAL_BASED_OUTPATIENT_CLINIC_OR_DEPARTMENT_OTHER): Payer: Self-pay | Admitting: Nurse Practitioner

## 2023-02-01 DIAGNOSIS — G629 Polyneuropathy, unspecified: Secondary | ICD-10-CM

## 2023-02-12 ENCOUNTER — Ambulatory Visit (HOSPITAL_BASED_OUTPATIENT_CLINIC_OR_DEPARTMENT_OTHER)
Admission: RE | Admit: 2023-02-12 | Discharge: 2023-02-12 | Disposition: A | Discharge status: Home / Self Care | Payer: Medicare HMO | Source: Ambulatory Visit | Attending: Nurse Practitioner | Admitting: Nurse Practitioner

## 2023-02-12 DIAGNOSIS — G629 Polyneuropathy, unspecified: Secondary | ICD-10-CM | POA: Diagnosis present

## 2023-05-02 ENCOUNTER — Other Ambulatory Visit: Payer: Self-pay | Admitting: Family Medicine

## 2023-05-10 ENCOUNTER — Inpatient Hospital Stay: Payer: Medicare HMO

## 2023-05-10 ENCOUNTER — Inpatient Hospital Stay: Payer: Medicare HMO | Attending: Oncology | Admitting: Oncology

## 2023-05-10 ENCOUNTER — Encounter: Payer: Self-pay | Admitting: Oncology

## 2023-05-10 VITALS — BP 147/86 | HR 78 | Temp 96.0°F | Resp 18 | Wt 158.3 lb

## 2023-05-10 DIAGNOSIS — Z809 Family history of malignant neoplasm, unspecified: Secondary | ICD-10-CM | POA: Insufficient documentation

## 2023-05-10 DIAGNOSIS — R9389 Abnormal findings on diagnostic imaging of other specified body structures: Secondary | ICD-10-CM | POA: Diagnosis present

## 2023-05-10 DIAGNOSIS — Z90710 Acquired absence of both cervix and uterus: Secondary | ICD-10-CM | POA: Diagnosis not present

## 2023-05-10 DIAGNOSIS — R200 Anesthesia of skin: Secondary | ICD-10-CM | POA: Diagnosis not present

## 2023-05-10 DIAGNOSIS — Z170 Estrogen receptor positive status [ER+]: Secondary | ICD-10-CM | POA: Insufficient documentation

## 2023-05-10 DIAGNOSIS — Z803 Family history of malignant neoplasm of breast: Secondary | ICD-10-CM | POA: Diagnosis not present

## 2023-05-10 DIAGNOSIS — C50411 Malignant neoplasm of upper-outer quadrant of right female breast: Secondary | ICD-10-CM | POA: Diagnosis not present

## 2023-05-10 NOTE — Assessment & Plan Note (Signed)
Neuropathy, ? 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 Consider neurology evaluation.

## 2023-05-10 NOTE — Assessment & Plan Note (Signed)
S/p lumpectomy in Jan 2024  Patient declined radiation and endocrine therapy. Recommend surveillance Mammogram and she will be due soon. . -Recommend follow-up with Dr. Dwain Sarna for mammogram. Surgery has recommended mammogram and patient declined. She has another appt with Dr. Dwain Sarna in December.

## 2023-05-10 NOTE — Assessment & Plan Note (Signed)
Non specific findings of mottled appearance calvarial marrow  Check bone scan.

## 2023-05-10 NOTE — Progress Notes (Signed)
Hematology/Oncology Consult Note [phone] [phone]     REFERRING PROVIDER: Estevan Oaks, NP    CHIEF COMPLAINTS/PURPOSE OF CONSULTATION:  Abnormal CT  ASSESSMENT & PLAN:   Family history of cancer Recommend genetic testing. She declined.   Malignant neoplasm of upper-outer quadrant of right breast in female, estrogen receptor positive [facility]) S/p lumpectomy in Jan 2024  Patient declined radiation and endocrine therapy. Recommend surveillance Mammogram and she will be due soon. . -Recommend follow-up with Dr. Dwain Sarna for mammogram. Surgery has recommended mammogram and patient declined. She has another appt with Dr. Dwain Sarna in December.    Abnormal CT scan, neck Non specific findings of mottled appearance calvarial marrow  Check bone scan.   Bilateral hand numbness Neuropathy, ? 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 Consider neurology evaluation.   Orders Placed This Encounter  Procedures   NM Bone Scan Whole Body    Standing Status:   Future    Standing Expiration Date:   05/09/2024    Order Specific Question:   If indicated for the ordered procedure, I authorize the administration of a radiopharmaceutical per Radiology protocol    Answer:   Yes    Order Specific Question:   Preferred imaging location?    Answer:   [facility]   Follow up PRN All questions were answered. The patient knows to call the clinic with any problems, questions or concerns.  Pamela Patience, MD, PhD [facility] Hematology Oncology 05/10/2023    HISTORY OF PRESENTING ILLNESS:  Pamela Small 77 y.o. female presents to establish care for abnormal CT I have reviewed her chart and materials related to her cancer extensively and collaborated history with the patient. Summary of oncologic history is as follows: Oncology History  Malignant neoplasm of upper-outer quadrant of right  breast in female, estrogen receptor positive (HCC)  05/23/2022 Mammogram   Diagnostic mammogram showed a 1.8 x 1.4 cm round high density mass with spiculated margin in the right breast upper outer aspect posterior depth 10 cm from the nipple correlates with the palpable marker.  There are segmental coarse heterogeneous calcs spanning 3 cm anterior to the mass in the upper outer breast middle to posterior depth 7 cm from the nipple.  No other significant masses calcifications or findings in either breast.   06/01/2022 Pathology Results   Pathology from right breast needle core biopsy 10:00 11 cm showed invasive ductal carcinoma, overall grade 1, benign lymph node, rest of the breast biopsies were benign, negative for malignancy.  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 SLNB was omitted due to her age. She decline radiation and adjuvant endocrine therapy.   She has a history of persistent numbness in her fingers.of both hands. Reports to have a history carpel tunnel syndrome and used to get steroid injections.  02/12/23 CT soft tissue neck w contrast showed  1. 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 2. Mottled appearance  of the calvarial marrow is nonspecific;however, given the patient's history of breast cancer, metastatic disease is not excluded. Consider nuclear medicine bone scan.3. Unremarkable soft tissues of the neck  Patient was referred to me for abnormal CT scan - mottled  appearance of calvarial marrow.  Patient denies any bone pain.  She has post surgery right breast pain. She continues to follow up with surgery Dr. Renaldo Fiddler and was seen recently. She was recommended to have a mammogram done which she prefers to defer due to breast pain.     MEDICAL HISTORY:  Past Medical History:  Diagnosis Date   Asthma    Hyperlipidemia    Hypertension    Lung nodules    seen on CT scan 2013   Multiple thyroid nodules    US guided bx was benign in 2013    SURGICAL HISTORY: Past Surgical History:  Procedure Laterality Date   ABDOMINAL HYSTERECTOMY     BREAST LUMPECTOMY WITH RADIOACTIVE SEED LOCALIZATION Right 08/04/2022   Procedure: RIGHT BREAST LUMPECTOMY WITH RADIOACTIVE SEED LOCALIZATION;  Surgeon: Emelia Loron, MD;  Location: [facility];  Service: General;  Laterality: Right;   COLONOSCOPY     FOOT ARTHRODESIS, MODIFIED MCBRIDE     POLYPECTOMY      SOCIAL HISTORY: Social History   Socioeconomic History   Marital status: Married    Spouse name: Not on file   Number of children: Not on file   Years of education: Not on file   Highest education level: Not on file  Occupational History   Not on file  Tobacco Use   Smoking status: Never   Smokeless tobacco: Never  Substance and Sexual Activity   Alcohol use: No   Drug use: No   Sexual activity: Never  Other Topics Concern   Not on file  Social History Narrative   Not on file   Social Determinants of Health   Financial Resource Strain: Low Risk  (06/15/2022)   Overall Financial Resource Strain (CARDIA)    Difficulty of Paying Living Expenses: Not very hard  Food Insecurity: No Food Insecurity (09/06/2022)   Hunger Vital Sign    Worried About Running Out of Food in the Last Year: Never true    Ran Out of Food in the Last Year: Never true  Transportation Needs: No Transportation Needs (09/06/2022)   PRAPARE - Administrator, Civil Service (Medical): No     Lack of Transportation (Non-Medical): No  Physical Activity: Not on file  Stress: Not on file  Social Connections: Unknown (05/30/2022)   [facility]   Social Network    Social Network: Not on file  Intimate Partner Violence: Not At Risk (09/06/2022)   Humiliation, Afraid, Rape, and Kick questionnaire    Fear of Current or Ex-Partner: No    Emotionally Abused: No    Physically Abused: No    Sexually Abused: No    FAMILY HISTORY: Family History  Problem Relation Age of Onset   Hyperlipidemia Mother    Asthma Mother    Diabetes Maternal Grandmother    Breast cancer Daughter     ALLERGIES:  is allergic to guaifenesin.  MEDICATIONS:  Current Outpatient Medications  Medication Sig Dispense Refill   acetaminophen (TYLENOL) 500 MG tablet Take by mouth.     albuterol (VENTOLIN HFA) 108 (90 Base) MCG/ACT inhaler Inhale into the lungs.     amLODipine (NORVASC) 5 MG tablet Take 1 tablet (5 mg total) by  mouth daily. 30 tablet 1   atenolol (TENORMIN) 50 MG tablet Take by mouth.     B Complex-C (B-COMPLEX WITH VITAMIN C) tablet Take by mouth.     Cholecalciferol 50 MCG (2000 UT) TABS Take by mouth.     cyanocobalamin (VITAMIN B12) 1000 MCG tablet Take by mouth.     ezetimibe (ZETIA) 10 MG tablet Take 1 tablet by mouth once daily 90 tablet 0   gabapentin (NEURONTIN) 300 MG capsule Take 300 mg by mouth at bedtime.     levothyroxine (SYNTHROID) 75 MCG tablet Take 75 mcg by mouth daily.     Multiple Vitamin (DAILY VITES) tablet Take 1 tablet by mouth at bedtime.     propylthiouracil (PTU) 50 MG tablet 1/2 tab every other day     rosuvastatin (CRESTOR) 40 MG tablet Take 40 mg by mouth daily.     solifenacin (VESICARE) 5 MG tablet Take by mouth.     traMADol (ULTRAM) 50 MG tablet Take 1 tablet (50 mg total) by mouth every 6 (six) hours as needed. 10 tablet 0   No current facility-administered medications for this visit.    Review of Systems  Constitutional:   Negative for appetite change, chills, fatigue and fever.  HENT:   Negative for hearing loss and voice change.   Eyes:  Negative for eye problems.  Respiratory:  Negative for chest tightness and cough.   Cardiovascular:  Negative for chest pain.  Gastrointestinal:  Negative for abdominal distention, abdominal pain and blood in stool.  Endocrine: Negative for hot flashes.  Genitourinary:  Negative for difficulty urinating and frequency.   Musculoskeletal:  Negative for arthralgias.  Skin:  Negative for itching and rash.  Neurological:  Positive for numbness. Negative for extremity weakness.  Hematological:  Negative for adenopathy.  Psychiatric/Behavioral:  Negative for confusion.      PHYSICAL EXAMINATION: ECOG PERFORMANCE STATUS: 1 - Symptomatic but completely ambulatory  Vitals:   05/10/23 0921  BP: (!) 147/86  Pulse: 78  Resp: 18  Temp: (!) 96 F (35.6 C)  SpO2: 98%   Filed Weights   05/10/23 0921  Weight: 158 lb 4.8 oz (71.8 kg)    Physical Exam Constitutional:      General: She is not in acute distress.    Appearance: She is not diaphoretic.  HENT:     Head: Normocephalic and atraumatic.     Nose: Nose normal.     Mouth/Throat:     Pharynx: No oropharyngeal exudate.  Eyes:     General: No scleral icterus.    Pupils: Pupils are equal, round, and reactive to light.  Cardiovascular:     Rate and Rhythm: Normal rate and regular rhythm.     Heart sounds: No murmur heard. Pulmonary:     Effort: Pulmonary effort is normal. No respiratory distress.     Breath sounds: No rales.  Chest:     Chest wall: No tenderness.  Abdominal:     General: There is no distension.     Palpations: Abdomen is soft.     Tenderness: There is no abdominal tenderness.  Musculoskeletal:        General: Normal range of motion.     Cervical back: Normal range of motion and neck supple.  Skin:    General: Skin is warm and dry.     Findings: No erythema.  Neurological:     Mental  Status: She is alert and oriented to person, place, and time.  Cranial Nerves: No cranial nerve deficit.     Motor: No abnormal muscle tone.     Coordination: Coordination normal.  Psychiatric:        Mood and Affect: Affect normal.    Breast exam was performed in seated and lying down position. Patient is status post right breast lumpectomy with a well-healed surgical scar.   No palpable breast masses bilaterally.  No palpable axillary adenopathy bilaterally.   LABORATORY DATA:  I have reviewed the data as listed    Latest Ref Rng & Units 06/15/2022    8:17 AM 09/26/2016    9:47 AM 12/31/2015    2:58 PM  CBC  WBC 4.0 - 10.5 K/uL 5.2  5.8  6.8   Hemoglobin 12.0 - 15.0 g/dL 81.1  91.4  78.2   Hematocrit 36.0 - 46.0 % 46.3  43.9  42.2   Platelets 150 - 400 K/uL 265  275        Latest Ref Rng & Units 06/15/2022    8:17 AM 03/07/2022   10:26 AM 09/26/2016    9:47 AM  CMP  Glucose 70 - 99 mg/dL 956  213  086   BUN 8 - 23 mg/dL 20  18  21    Creatinine 0.44 - 1.00 mg/dL 5.78  4.69  6.29   Sodium 135 - 145 mmol/L 138  140  144   Potassium 3.5 - 5.1 mmol/L 4.3  4.5  4.8   Chloride 98 - 111 mmol/L 103  103  102   CO2 22 - 32 mmol/L 29  25  25    Calcium 8.9 - 10.3 mg/dL 9.6  9.2  9.4   Total Protein 6.5 - 8.1 g/dL 7.6   7.4   Total Bilirubin 0.3 - 1.2 mg/dL 0.4   0.5   Alkaline Phos 38 - 126 U/L 138   171   AST 15 - 41 U/L 18  22  17    ALT 0 - 44 U/L 14  13  8       RADIOGRAPHIC STUDIES: I have personally reviewed the radiological images as listed and agreed with the findings in the report. No results found.

## 2023-05-10 NOTE — Assessment & Plan Note (Signed)
Recommend genetic testing. She declined.

## 2023-05-17 ENCOUNTER — Other Ambulatory Visit: Payer: Medicare HMO

## 2023-05-17 ENCOUNTER — Encounter: Payer: Self-pay | Admitting: Physical Medicine & Rehabilitation

## 2023-05-22 ENCOUNTER — Ambulatory Visit
Admission: RE | Admit: 2023-05-22 | Discharge: 2023-05-22 | Disposition: A | Discharge status: Home / Self Care | Payer: Medicare HMO | Source: Ambulatory Visit | Attending: Oncology | Admitting: Oncology

## 2023-05-22 ENCOUNTER — Ambulatory Visit
Admission: RE | Admit: 2023-05-22 | Discharge: 2023-05-22 | Disposition: A | Discharge status: Home / Self Care | Payer: Medicare HMO | Source: Ambulatory Visit | Attending: Oncology

## 2023-05-22 DIAGNOSIS — R9389 Abnormal findings on diagnostic imaging of other specified body structures: Secondary | ICD-10-CM | POA: Diagnosis present

## 2023-05-22 MED ORDER — TECHNETIUM TC 99M MEDRONATE IV KIT
20.0000 | PACK | Freq: Once | INTRAVENOUS | Status: AC | PRN
  Administered 2023-05-22: 21.48 via INTRAVENOUS

## 2023-06-27 ENCOUNTER — Telehealth: Payer: Self-pay

## 2023-06-27 DIAGNOSIS — R948 Abnormal results of function studies of other organs and systems: Secondary | ICD-10-CM

## 2023-06-27 NOTE — Telephone Encounter (Signed)
Called pt, no answer. Detailed VM left on phone. Informed her that xray is a walk-in and not appt is needed.   Please schedule and notify/ mail appt:  MD in 6 months

## 2023-06-27 NOTE — Telephone Encounter (Signed)
-----   Message from Rickard Patience sent at 06/26/2023 10:09 PM EST ----- Please let her know that bone scan showed no definitive cancer in her bones.  Non specific uptake in her scalp bone. I recommend scalp xray  Please arrange follow up in 6 months, MD only. Thanks.

## 2023-06-27 NOTE — Telephone Encounter (Signed)
Patient returned a phone call.  

## 2023-07-03 NOTE — Progress Notes (Deleted)
CARDIOLOGY CONSULT NOTE       Patient ID: RAKYA GIAMBATTISTA MRN: [id number] DOB/AGE: Nov 20, 1945 77 y.o.  Admit date: (Not on file) Referring Physician: Manson Passey Primary Physician: Estevan Oaks, NP Primary Cardiologist: New Reason for Consultation: CAD  Active Problems:   * No active hospital problems. *   HPI:  77 y.o. referred for CAD by Manson Passey NP. She was last seen in 2016. History of HTN, HLD, carpal tunnel, breast cancer with right lumpectomy January 2024 followed by Dr Dwain Sarna. CT done 2013 with non obstructive dx calcium score 58 lung nodules. Myovue normal 2016 EF >70% TTE done 2016 with normal EF 60-65% mild MR   Seen by oncology 05/10/23 patient declined XRT and endocrine Rx Noted carpal tunnel and cervical spine dx causing numbness in hands   Saw Dr Chestine Spore 12/20/22 ? PAD but pain in right toe ? Previous bunion surgery ABI's were normal   ***  ROS All other systems reviewed and negative except as noted above  Past Medical History:  Diagnosis Date   Abnormal CT scan    Asthma    Breast cancer (HCC)    Hyperlipidemia    Hypertension    Lung nodules    seen on CT scan 2013   Multiple thyroid nodules    US guided bx was benign in 2013   Narrowing of intervertebral disc space    Numbness of fingers of both hands     Family History  Problem Relation Age of Onset   Hyperlipidemia Mother    Asthma Mother    Diabetes Maternal Grandmother    Breast cancer Daughter     Social History   Socioeconomic History   Marital status: Married    Spouse name: Not on file   Number of children: Not on file   Years of education: Not on file   Highest education level: Not on file  Occupational History   Not on file  Tobacco Use   Smoking status: Never   Smokeless tobacco: Never  Substance and Sexual Activity   Alcohol use: No   Drug use: No   Sexual activity: Never  Other Topics Concern   Not on file  Social History Narrative   Not on file   Social Drivers of  Health   Financial Resource Strain: Low Risk  (06/15/2022)   Overall Financial Resource Strain (CARDIA)    Difficulty of Paying Living Expenses: Not very hard  Food Insecurity: No Food Insecurity (09/06/2022)   Hunger Vital Sign    Worried About Running Out of Food in the Last Year: Never true    Ran Out of Food in the Last Year: Never true  Transportation Needs: No Transportation Needs (09/06/2022)   PRAPARE - Administrator, Civil Service (Medical): No    Lack of Transportation (Non-Medical): No  Physical Activity: Not on file  Stress: Not on file  Social Connections: Unknown (05/30/2022)   Received from [facility], [facility]   Social Network    Social Network: Not on file  Intimate Partner Violence: Not At Risk (09/06/2022)   Humiliation, Afraid, Rape, and Kick questionnaire    Fear of Current or Ex-Partner: No    Emotionally Abused: No    Physically Abused: No    Sexually Abused: No    Past Surgical History:  Procedure Laterality Date   ABDOMINAL HYSTERECTOMY     BREAST LUMPECTOMY WITH RADIOACTIVE SEED LOCALIZATION Right 08/04/2022   Procedure: RIGHT BREAST LUMPECTOMY WITH RADIOACTIVE  SEED LOCALIZATION;  Surgeon: Emelia Loron, MD;  Location: [facility];  Service: General;  Laterality: Right;   COLONOSCOPY     FOOT ARTHRODESIS, MODIFIED MCBRIDE     POLYPECTOMY        Current Outpatient Medications:    acetaminophen (TYLENOL) 500 MG tablet, Take by mouth., Disp: , Rfl:    albuterol (VENTOLIN HFA) 108 (90 Base) MCG/ACT inhaler, Inhale into the lungs., Disp: , Rfl:    amLODipine (NORVASC) 5 MG tablet, Take 1 tablet (5 mg total) by mouth daily., Disp: 30 tablet, Rfl: 1   atenolol (TENORMIN) 50 MG tablet, Take by mouth., Disp: , Rfl:    B Complex-C (B-COMPLEX WITH VITAMIN C) tablet, Take by mouth., Disp: , Rfl:    Cholecalciferol 50 MCG (2000 UT) TABS, Take by mouth., Disp: , Rfl:    cyanocobalamin (VITAMIN B12) 1000 MCG tablet, Take by  mouth., Disp: , Rfl:    ezetimibe (ZETIA) 10 MG tablet, Take 1 tablet by mouth once daily, Disp: 90 tablet, Rfl: 0   gabapentin (NEURONTIN) 300 MG capsule, Take 300 mg by mouth at bedtime., Disp: , Rfl:    levothyroxine (SYNTHROID) 75 MCG tablet, Take 75 mcg by mouth daily., Disp: , Rfl:    Multiple Vitamin (DAILY VITES) tablet, Take 1 tablet by mouth at bedtime., Disp: , Rfl:    propylthiouracil (PTU) 50 MG tablet, 1/2 tab every other day, Disp: , Rfl:    rosuvastatin (CRESTOR) 40 MG tablet, Take 40 mg by mouth daily., Disp: , Rfl:    solifenacin (VESICARE) 5 MG tablet, Take by mouth., Disp: , Rfl:    traMADol (ULTRAM) 50 MG tablet, Take 1 tablet (50 mg total) by mouth every 6 (six) hours as needed., Disp: 10 tablet, Rfl: 0    Physical Exam: There were no vitals taken for this visit.   Affect appropriate Healthy:  appears stated age HEENT: normal Neck supple with no adenopathy JVP normal no bruits no thyromegaly Lungs clear with no wheezing and good diaphragmatic motion Heart:  S1/S2 no murmur, no rub, gallop or click PMI normal Abdomen: benighn, BS positve, no tenderness, no AAA no bruit.  No HSM or HJR Distal pulses intact with no bruits No edema Neuro non-focal Skin warm and dry No muscular weakness   Labs:   Lab Results  Component Value Date   WBC 5.2 06/15/2022   HGB 15.0 06/15/2022   HCT 46.3 (H) 06/15/2022   MCV 88.9 06/15/2022   PLT 265 06/15/2022   No results for input(s): "NA", "K", "CL", "CO2", "BUN", "CREATININE", "CALCIUM", "PROT", "BILITOT", "ALKPHOS", "ALT", "AST", "GLUCOSE" in the last 168 hours.  Invalid input(s): "LABALBU" Lab Results  Component Value Date   TROPONINI <0.30 05/10/2012    Lab Results  Component Value Date   CHOL 356 (H) 03/07/2022   CHOL 333 (H) 09/26/2016   CHOL 325 (H) 05/01/2015   Lab Results  Component Value Date   HDL 60 03/07/2022   HDL 53 09/26/2016   HDL 57 05/01/2015   Lab Results  Component Value Date   LDLCALC  282 (H) 03/07/2022   LDLCALC 261 (H) 09/26/2016   LDLCALC 249 (H) 05/01/2015   Lab Results  Component Value Date   TRIG 90 03/07/2022   TRIG 96 09/26/2016   TRIG 93 05/01/2015   Lab Results  Component Value Date   CHOLHDL 5.9 (H) 03/07/2022   CHOLHDL 6.3 (H) 09/26/2016   CHOLHDL 5.7 (H) 05/01/2015   No results found  for: "LDLDIRECT"    Radiology: No results found.  EKG: SR rate 61 normal with some motion artifact   ASSESSMENT AND PLAN:   CAD:  high calcium score for age in 2013 normal myovue 2016 TC has been very high in 356 range and LDL 282 suggesting familial HLD She is on crestor and zetia *** HTN:  continue atenolol and norvasce low sodium DASH diet Weight loss Breast Cancer:  post right lumpectomy 07/2022 f/u [facility] *** Carpal Tunnel:  vs cervical disc dx per primary on neurontin  ***  F/U in a year   Signed: Charlton Haws 07/03/2023, 10:15 AM

## 2023-07-04 ENCOUNTER — Encounter: Payer: Medicare HMO | Attending: Physical Medicine & Rehabilitation | Admitting: Physical Medicine & Rehabilitation

## 2023-07-04 ENCOUNTER — Encounter: Payer: Self-pay | Admitting: Physical Medicine & Rehabilitation

## 2023-07-04 VITALS — BP 139/82 | HR 57 | Ht 62.5 in | Wt 159.0 lb

## 2023-07-04 DIAGNOSIS — R202 Paresthesia of skin: Secondary | ICD-10-CM | POA: Diagnosis present

## 2023-07-04 DIAGNOSIS — R200 Anesthesia of skin: Secondary | ICD-10-CM | POA: Insufficient documentation

## 2023-07-04 NOTE — Patient Instructions (Signed)
Electromyoneurogram Electromyoneurogram is a test to check how well your muscles and nerves are working. This procedure includes the combined use of electromyogram (EMG) and nerve conduction study (NCS). EMG is used to evaluate muscles and the nerves that control those muscles. NCS, which is also called electroneurogram, measures how well your nerves conduct electricity. The procedures should be done together to check if your muscles and nerves are healthy. If the results of the tests are abnormal, this may indicate disease or injury, such as a neuromuscular disease or peripheral nerve damage. Tell a health care provider about: Any allergies you have. All medicines you are taking, including vitamins, herbs, eye drops, creams, and over-the-counter medicines. Any bleeding problems you have. Any surgeries you have had. Any medical conditions you have. What are the risks? Generally, this is a safe procedure. However, problems may occur, including: Bleeding or bruising. Infection where the electrodes were inserted. What happens before the test? Medicines Take all of your usually prescribed medications before this testing is performed. Do not stop your blood thinners unless advised by your prescribing physician. General instructions Your health care provider may ask you to warm the limb that will be checked with warm water, hot pack, or wrapping the limb in a blanket. Do not use lotions or creams on the same day that you will be having the procedure. What happens during the test? For EMG  Your health care provider will ask you to stay in a position so that the muscle being studied can be accessed. You will be sitting or lying down. You may be given a medicine to numb the area (local anesthetic) and the skin will be disinfected. A very thin needle that has an electrode will be inserted into your muscle, one muscle at a time. Typically, multiple muscles are evaluated during a single study. Another  small electrode will be placed on your skin near the muscle. Your health care provider will ask you to continue to remain still. The electrodes will record the electrical activity of your muscles. You may see this on a monitor or hear it in the room. After your muscles have been studied at rest, your health care provider will ask you to contract or flex your muscles. The electrodes will record the electrical activity of your muscles. Your health care provider will remove the electrodes and the electrode needle when the procedure is finished. The procedure may vary among health care providers and hospitals. For NCS  An electrode that records your nerve activity (recording electrode) will be placed on your skin by the muscle that is being studied. An electrode that is used as a reference (reference electrode) will be placed near the recording electrode. A paste or gel will be applied to your skin between the recording electrode and the reference electrode. Your nerve will be stimulated with a mild shock. The speed of the nerves and strength of response is recorded by the electrodes. Your health care provider will remove the electrodes and the gel when the procedure is finished. The procedure may vary among health care providers and hospitals. What can I expect after the test? It is up to you to get your test results. Ask your health care provider, or the department that is doing the test, when your results will be ready. Your health care provider may: Give you medicines for any pain. Monitor the insertion sites to make sure that bleeding stops. You should be able to drive yourself to and from the test. Discomfort can  persist for a few hours after the test, but should be better the next day. Contact a health care provider if: You have swelling, redness, or drainage at any of the insertion sites. Summary Electromyoneurogram is a test to check how well your muscles and nerves are working. If the  results of the tests are abnormal, this may indicate disease or injury. This is a safe procedure. However, problems may occur, such as bleeding and infection. Your health care provider will do two tests to complete this procedure. One checks your muscles (EMG) and another checks your nerves (NCS). It is up to you to get your test results. Ask your health care provider, or the department that is doing the test, when your results will be ready. This information is not intended to replace advice given to you by your health care provider. Make sure you discuss any questions you have with your health care provider. Document Revised: 03/17/2021 Document Reviewed: 02/14/2021 Elsevier Patient Education  2024 [employer].

## 2023-07-04 NOTE — Progress Notes (Signed)
Subjective:    Patient ID: Pamela Small, female    DOB: [birth year], 77 y.o.   MRN: [id number]  HPI 77 year old female with history of angina, asthma, breast carcinoma, hyper active bladder, hypothyroidism referred by primary care physician to evaluate bilateral hand numbness.  Patient with neck stiffness denies neck pain.  She did have imaging studies of the cervical spine reviewed as below agree with reading. Treated for carpal tunnel syndrome ~58yrs ago.  Wore wrist splints and had carpal tunnel injections in [place] which helped with the problem, but it reoccurred starting at 1 year ago  No recent trauma to the neck, hands, wrists. She feels like all her fingers are numb she drops objects.  She has not been using her wrist splints.  CT NECK WITHOUT CONTRAST   TECHNIQUE: Multidetector CT imaging of the neck was performed following the standard protocol without intravenous contrast.   RADIATION DOSE REDUCTION: This exam was performed according to the departmental dose-optimization program which includes automated exposure control, adjustment of the mA and/or kV according to patient size and/or use of iterative reconstruction technique.   COMPARISON:  CT neck 06/08/2012   FINDINGS: Pharynx and larynx: The nasal cavity and nasopharynx are unremarkable.   The oral cavity and oropharynx are unremarkable. The parapharyngeal spaces are clear.   The hypopharynx and larynx are unremarkable. The vocal folds are unremarkable. The epiglottis is normal. The airway is patent. There is no retropharyngeal collection.   Salivary glands: The parotid and submandibular glands are unremarkable.   Thyroid: There is a peripherally calcified left thyroid nodule measuring up to 1.3 cm, present since 2013 and requiring no specific imaging follow-up.   Lymph nodes: There is no pathologic lymphadenopathy in the neck.   Vascular: There is mild plaque at the carotid bifurcations.   Limited  intracranial: Unremarkable.   Visualized orbits: Bilateral lens implants are in place. The globes and orbits are otherwise unremarkable.   Mastoids and visualized paranasal sinuses: Clear.   Skeleton: The calvarial marrow has a diffusely mottled appearance with areas of ill-defined lucency (for example 5-56).   There is advanced disc space narrowing and degenerative endplate change at C3-C4 through C5-C6. Associated disc protrusions resulting in up to moderate spinal canal stenosis at C3-C4 through C5-C6. There is minimal facet arthropathy; however, multilevel uncovertebral ridging resulting in moderate neural foraminal stenosis on the left at C3-C4 and bilaterally at C6-C7.   Upper chest: The imaged lung apices are clear.   Other: None.   IMPRESSION: 1. 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 2. Mottled appearance of the calvarial marrow is nonspecific; however, given the patient's history of breast cancer, metastatic disease is not excluded. Consider nuclear medicine bone scan. 3. Unremarkable soft tissues of the neck.     Electronically Signed   By: Lesia Hausen M.D.   On: 02/22/2023 10:54   Neck stiffness but not neck pain, no shoulder numbness or weakness no upper arm weakness no problem with walking. Pain Inventory Average Pain 3 Pain Right Now 0 My pain is burning  In the last 24 hours, has pain interfered with the following? General activity 0 Relation with others 0 Enjoyment of life 0 What TIME of day is your pain at its worst? varies Sleep (in general) Fair  Pain is worse with: unsure  walk without assistance ability  to climb steps?  no do you drive?  yes Do you have any goals in this area?  no  not employed: date last employed .  bladder control problems bowel  control problems dizziness  Any changes since last visit?  no  Any changes since last visit?  no    Family History  Problem Relation Age of Onset   Hyperlipidemia Mother    Asthma Mother    Diabetes Maternal Grandmother    Breast cancer Daughter    Social History   Socioeconomic History   Marital status: Married    Spouse name: Not on file   Number of children: Not on file   Years of education: Not on file   Highest education level: Not on file  Occupational History   Not on file  Tobacco Use   Smoking status: Never   Smokeless tobacco: Never  Substance and Sexual Activity   Alcohol use: No   Drug use: No   Sexual activity: Never  Other Topics Concern   Not on file  Social History Narrative   Not on file   Social Drivers of Health   Financial Resource Strain: Low Risk  (06/15/2022)   Overall Financial Resource Strain (CARDIA)    Difficulty of Paying Living Expenses: Not very hard  Food Insecurity: No Food Insecurity (09/06/2022)   Hunger Vital Sign    Worried About Running Out of Food in the Last Year: Never true    Ran Out of Food in the Last Year: Never true  Transportation Needs: No Transportation Needs (09/06/2022)   PRAPARE - Administrator, Civil Service (Medical): No    Lack of Transportation (Non-Medical): No  Physical Activity: Not on file  Stress: Not on file  Social Connections: Unknown (05/30/2022)   Received from [facility]   Social Network    Social Network: Not on file   Past Surgical History:  Procedure Laterality Date   ABDOMINAL HYSTERECTOMY     BREAST LUMPECTOMY WITH RADIOACTIVE SEED LOCALIZATION Right 08/04/2022   Procedure: RIGHT BREAST LUMPECTOMY WITH RADIOACTIVE SEED LOCALIZATION;  Surgeon: Emelia Loron, MD;  Location: [facility];  Service: General;  Laterality: Right;   COLONOSCOPY     FOOT ARTHRODESIS, MODIFIED MCBRIDE     POLYPECTOMY     Past Medical History:  Diagnosis Date    Abnormal CT scan    Asthma    Breast cancer (HCC)    Hyperlipidemia    Hypertension    Lung nodules    seen on CT scan 2013   Multiple thyroid nodules    US guided bx was benign in 2013   Narrowing of intervertebral disc space    Numbness of fingers of both hands    Ht 5' 2.5" (1.588 m)   Wt 159 lb (72.1 kg)   BMI 28.62 kg/m   Opioid Risk Score:   Fall Risk Score:  `1  Depression screen [facility] 2/9     05/10/2023   10:07 AM 09/06/2022    7:44 AM 03/23/2022    9:29 AM 03/07/2022    9:45 AM 06/21/2016    9:40 AM 03/23/2016    2:41 PM 12/31/2015    1:49 PM  Depression screen PHQ 2/9  Decreased Interest 1 0 0 0 0 0 0  Down, Depressed, Hopeless 0 0 0 0 0 0 0  PHQ - 2 Score 1 0 0 0 0 0 0  Altered sleeping   0 0  Tired, decreased energy   0 0     Change in appetite   0 0     Feeling bad or failure about yourself    0 0     Trouble concentrating   0 0     Moving slowly or fidgety/restless   0 0     Suicidal thoughts   0 0     PHQ-9 Score   0 0     Difficult doing work/chores   Not difficult at all Not difficult at all         Review of Systems  All other systems reviewed and are negative.     Objective:   Physical Exam  Negative Tinel's Negative Phalen's Sensation to pinprick reduced on digits 234 but intact on digit 5 light touch sensation is reduced in all 5 digits. Motor strength is 5/5 bilateral deltoid bicep tricep grip Sensation over the shoulder area is intact to pinprick bilaterally Cervical spine range of motion is 75% flexion extension lateral bending and rotation Ambulates without assistive device no evidence toe drag or knee instability Negative foraminal compression test Left palm no evidence of thenar or hypothenar atrophy no trigger fingers Left palm no evidence of thenar or hypothenar atrophy no trigger fingers      Assessment & Plan:  1.  Bilateral hand numbness sparing the fifth digit on pinprick exam.  Most likely cause would be carpal tunnel  syndrome.  She seems to have responded to treatment some years ago.  She does have a underlying hypothyroidism which would predispose her to this problem.  She also has cervical spinal stenosis but given the levels involved would suspect mainly shoulder and upper arm symptoms. Would recommend EMG/NCV bilateral upper extremities to further delineate.  Advised patient to wear wrist splints at night.

## 2023-07-10 ENCOUNTER — Ambulatory Visit: Payer: Medicare HMO | Admitting: Cardiovascular Disease

## 2023-07-10 NOTE — Progress Notes (Unsigned)
CARDIOLOGY CONSULT NOTE       Patient ID: Pamela Small MRN: [id number] DOB/AGE: 1946-03-17 77 y.o.   Referring Physician: Manson Passey Primary Physician: Estevan Oaks, NP Primary Cardiologist: New Reason for Consultation: Chest pain     HPI:  77 y.o. referred by NP Manson Passey for chest pain. 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   Last seen by [place] in 2016 cardiac CTA done 05/11/12  with calcium score 58 which is 79 th percentile  with non obstructive CAD read by radiology as 0-25% in LM. 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 Has palpitations at night and atypical SSCP sharp pain similar to after she takes statin rest/exertion She has palpitations at night when she lays down She indicates "falling out" twice while doing house work Thinks her BS got low. She has had tyroid radioactive iodine TSH has been high and not rechecked since her dose adjusted a couple of months ago  ROS All other systems reviewed and negative except as noted above  Past Medical History:  Diagnosis Date   Abnormal CT scan    Asthma    Breast cancer (HCC)    Hyperlipidemia    Hypertension    Lung nodules    seen on CT scan 2013   Multiple thyroid nodules    US guided bx was benign in 2013   Narrowing of intervertebral disc space    Numbness of fingers of both hands     Family History  Problem Relation Age of Onset   Hyperlipidemia Mother    Asthma Mother    Diabetes Maternal Grandmother    Breast cancer Daughter     Social  History   Socioeconomic History   Marital status: Married    Spouse name: Not on file   Number of children: Not on file   Years of education: Not on file   Highest education level: Not on file  Occupational History   Not on file  Tobacco Use   Smoking status: Never   Smokeless tobacco: Never  Substance and Sexual Activity   Alcohol use: No   Drug use: No   Sexual activity: Never  Other Topics Concern   Not on file  Social History Narrative   Not on file   Social Drivers of Health   Financial Resource Strain: Low Risk  (06/15/2022)   Overall Financial Resource Strain (CARDIA)    Difficulty of Paying Living Expenses: Not very hard  Food Insecurity: No Food Insecurity (09/06/2022)   Hunger Vital Sign    Worried About Running Out of Food in the Last Year: Never true    Ran Out of Food in the Last Year: Never true  Transportation Needs: No Transportation Needs (09/06/2022)   PRAPARE - Administrator, Civil Service (Medical): No    Lack of Transportation (Non-Medical): No  Physical Activity: Not on file  Stress: Not on file  Social  Connections: Unknown (05/30/2022)   [facility]   Social Network    Social Network: Not on file  Intimate Partner Violence: Not At Risk (09/06/2022)   Humiliation, Afraid, Rape, and Kick questionnaire    Fear of Current or Ex-Partner: No    Emotionally Abused: No    Physically Abused: No    Sexually Abused: No    Past Surgical History:  Procedure Laterality Date   ABDOMINAL HYSTERECTOMY     BREAST LUMPECTOMY WITH RADIOACTIVE SEED LOCALIZATION Right 08/04/2022   Procedure: RIGHT BREAST LUMPECTOMY WITH RADIOACTIVE SEED LOCALIZATION;  Surgeon: Emelia Loron, MD;  Location: [facility];  Service: General;  Laterality: Right;   COLONOSCOPY     FOOT ARTHRODESIS, MODIFIED MCBRIDE     POLYPECTOMY        Current Outpatient Medications:    acetaminophen (TYLENOL) 500 MG tablet, Take by  mouth., Disp: , Rfl:    albuterol (VENTOLIN HFA) 108 (90 Base) MCG/ACT inhaler, Inhale into the lungs., Disp: , Rfl:    amLODipine (NORVASC) 5 MG tablet, Take 1 tablet (5 mg total) by mouth daily., Disp: 30 tablet, Rfl: 1   atenolol (TENORMIN) 50 MG tablet, Take by mouth., Disp: , Rfl:    B Complex-C (B-COMPLEX WITH VITAMIN C) tablet, Take by mouth., Disp: , Rfl:    Cholecalciferol 50 MCG (2000 UT) TABS, Take by mouth., Disp: , Rfl:    cyanocobalamin (VITAMIN B12) 1000 MCG tablet, Take by mouth., Disp: , Rfl:    ezetimibe (ZETIA) 10 MG tablet, Take 1 tablet by mouth once daily, Disp: 90 tablet, Rfl: 0   gabapentin (NEURONTIN) 300 MG capsule, Take 300 mg by mouth at bedtime., Disp: , Rfl:    levothyroxine (SYNTHROID) 75 MCG tablet, Take 75 mcg by mouth daily., Disp: , Rfl:    Multiple Vitamin (DAILY VITES) tablet, Take 1 tablet by mouth at bedtime., Disp: , Rfl:    propylthiouracil (PTU) 50 MG tablet, 1/2 tab every other day, Disp: , Rfl:    rosuvastatin (CRESTOR) 40 MG tablet, Take 40 mg by mouth daily., Disp: , Rfl:    solifenacin (VESICARE) 5 MG tablet, Take by mouth., Disp: , Rfl:    traMADol (ULTRAM) 50 MG tablet, Take 1 tablet (50 mg total) by mouth every 6 (six) hours as needed., Disp: 10 tablet, Rfl: 0    Physical Exam: There were no vitals taken for this visit.    Affect appropriate Healthy:  appears stated age HEENT: normal Neck supple with no adenopathy JVP normal no bruits no thyromegaly Lungs clear with no wheezing and good diaphragmatic motion Heart:  S1/S2 no murmur, no rub, gallop or click PMI normal prior right breast lumpectomy Abdomen: benighn, BS positve, no tenderness, no AAA no bruit.  No HSM or HJR Distal pulses intact with no bruits No edema Neuro non-focal Skin warm and dry No muscular weakness   Labs:   Lab Results  Component Value Date   WBC 5.2 06/15/2022   HGB 15.0 06/15/2022   HCT 46.3 (H) 06/15/2022   MCV 88.9 06/15/2022   PLT 265 06/15/2022    No results for input(s): "NA", "K", "CL", "CO2", "BUN", "CREATININE", "CALCIUM", "PROT", "BILITOT", "ALKPHOS", "ALT", "AST", "GLUCOSE" in the last 168 hours.  Invalid input(s): "LABALBU" Lab Results  Component Value Date   TROPONINI <0.30 05/10/2012    Lab Results  Component Value Date   CHOL 356 (H) 03/07/2022   CHOL 333 (H) 09/26/2016   CHOL 325 (  H) 05/01/2015   Lab Results  Component Value Date   HDL 60 03/07/2022   HDL 53 09/26/2016   HDL 57 05/01/2015   Lab Results  Component Value Date   LDLCALC 282 (H) 03/07/2022   LDLCALC 261 (H) 09/26/2016   LDLCALC 249 (H) 05/01/2015   Lab Results  Component Value Date   TRIG 90 03/07/2022   TRIG 96 09/26/2016   TRIG 93 05/01/2015   Lab Results  Component Value Date   CHOLHDL 5.9 (H) 03/07/2022   CHOLHDL 6.3 (H) 09/26/2016   CHOLHDL 5.7 (H) 05/01/2015   No results found for: "LDLDIRECT"    Radiology: No results found.  EKG: SR rate 61 normal 08/02/22     ASSESSMENT AND PLAN:   CAD: noted in 2013 with high calcium score for age. F/U myovue non ischemic in 2016. CRF;s HTN and HLD Shared decision making order PET/CT  HTN:  continue norvasc and atenolol  HLD:  currently on crestor 40 mg and zetia 10 mg Needs PSK9 Start Repatha every two weeks Cut crestor down to 10 mg to see if myalgias improve Continue Zetia  Thyroid:  on synthroid replacement with history goiter TSH was 42 03/07/22 Check labs today  Breast Cancer:  f/u Dr Dwain Sarna. Right lumpectomy 07/2022 stage 1 declined endocrine/XRT Rx f/u mammogram per oncology   Lipid/Liver TSH/T4 Repatha q 2 weeks Cut Crestor back to 10 mg Refer to lipid clinic F/U [place] for likely familial HLD PET/CT  F/U in a year    Signed: Charlton Haws 07/11/2023, 8:35 AM

## 2023-07-11 ENCOUNTER — Ambulatory Visit: Payer: Medicare HMO | Attending: Cardiovascular Disease | Admitting: Cardiovascular Disease

## 2023-07-11 ENCOUNTER — Telehealth: Payer: Self-pay | Admitting: Pharmacy Technician

## 2023-07-11 ENCOUNTER — Other Ambulatory Visit (HOSPITAL_COMMUNITY): Payer: Self-pay

## 2023-07-11 DIAGNOSIS — I25118 Atherosclerotic heart disease of native coronary artery with other forms of angina pectoris: Secondary | ICD-10-CM

## 2023-07-11 DIAGNOSIS — E0590 Thyrotoxicosis, unspecified without thyrotoxic crisis or storm: Secondary | ICD-10-CM | POA: Diagnosis not present

## 2023-07-11 DIAGNOSIS — I10 Essential (primary) hypertension: Secondary | ICD-10-CM

## 2023-07-11 DIAGNOSIS — I2510 Atherosclerotic heart disease of native coronary artery without angina pectoris: Secondary | ICD-10-CM

## 2023-07-11 DIAGNOSIS — E7801 Familial hypercholesterolemia: Secondary | ICD-10-CM

## 2023-07-11 DIAGNOSIS — C50011 Malignant neoplasm of nipple and areola, right female breast: Secondary | ICD-10-CM

## 2023-07-11 DIAGNOSIS — I209 Angina pectoris, unspecified: Secondary | ICD-10-CM

## 2023-07-11 MED ORDER — REPATHA 140 MG/ML ~~LOC~~ SOSY: 140.0000 mg | PREFILLED_SYRINGE | SUBCUTANEOUS | 11 refills | Status: DC

## 2023-07-11 MED ORDER — ROSUVASTATIN CALCIUM 10 MG PO TABS: 10.0000 mg | ORAL_TABLET | Freq: Every day | ORAL | 3 refills | Status: DC

## 2023-07-11 NOTE — Telephone Encounter (Signed)
-----   Message from Nurse Deon Pilling sent at 07/11/2023 12:10 PM EST ----- Dr. Eden Emms started patient on Repatha.

## 2023-07-11 NOTE — Patient Instructions (Addendum)
Medication Instructions:  Your physician has recommended you make the following change in your medication:  1-DECREASE Rosuvastatin 10 mg by mouth daily. 2-START Repatha 140 mg/mL subcutaneous injection every 2 weeks  *If you need a refill on your cardiac medications before your next appointment, please call your pharmacy*   Lab Work: Your physician recommends that you have lab work at [facility] for fasting lipid and liver panel.  If you have labs (blood work) drawn today and your tests are completely normal, you will receive your results only by: MyChart Message (if you have MyChart) OR A paper copy in the mail If you have any lab test that is abnormal or we need to change your treatment, we will call you to review the results.  Testing/Procedures: Your physician has requested that you have cardiac PET CT. Cardiac computed tomography (CT) is a painless test that uses an x-ray machine to take clear, detailed pictures of your heart. For further information please visit [email or web address] Please follow instruction sheet as given.  Follow-Up: At [facility], you and your health needs are our priority.  As part of our continuing mission to provide you with exceptional heart care, we have created designated Provider Care Teams.  These Care Teams include your primary Cardiologist (physician) and Advanced Practice Providers (APPs -  Physician Assistants and Nurse Practitioners) who all work together to provide you with the care you need, when you need it.  We recommend signing up for the patient portal called "MyChart".  Sign up information is provided on this After Visit Summary.  MyChart is used to connect with patients for Virtual Visits (Telemedicine).  Patients are able to view lab/test results, encounter notes, upcoming appointments, etc.  Non-urgent messages can be sent to your provider as well.   To learn more about what you can do with MyChart, go to [email or web address].     Your next appointment:   1 year(s)  Provider:   Charlton Haws, MD     You have been referred to Lipid Clinic with Dr. Rennis Golden.  Other Instructions     Please report to Radiology at the [facility] Main Entrance 30 minutes early for your test.  [address] [place], [place] [address]  How to Prepare for Your Cardiac PET/CT Stress Test:  Nothing to eat or drink, except water, 3 hours prior to arrival time.  NO caffeine/decaffeinated products, or chocolate 12 hours prior to arrival. (Please note decaffeinated beverages (teas/coffees) still contain caffeine).  If you have caffeine within 12 hours prior, the test will need to be rescheduled.  Medication instructions: Do not take erectile dysfunction medications for 72 hours prior to test (sildenafil, tadalafil) Do not take nitrates (isosorbide mononitrate, Ranexa) the day before or day of test Do not take tamsulosin the day before or morning of test Hold theophylline containing medications for 12 hours. Hold Dipyridamole 48 hours prior to the test.  You may take your remaining medications with water.  NO perfume, cologne or lotion on chest or abdomen area. FEMALES - Please avoid wearing dresses to this appointment.  Total time is 1 to 2 hours; you may want to bring reading material for the waiting time.  IF YOU THINK YOU MAY BE PREGNANT, OR ARE NURSING PLEASE INFORM THE TECHNOLOGIST.  In preparation for your appointment, medication and supplies will be purchased.  Appointment availability is limited, so if you need to cancel or reschedule, please call the Radiology Department at [phone] [facility]) OR [phone] (  [facility]) 24 hours in advance to avoid a cancellation fee of $100.00  What to Expect When you Arrive:  Once you arrive and check in for your appointment, you will be taken to a preparation room within the Radiology Department.  A technologist or Nurse will obtain your medical history, verify that you  are correctly prepped for the exam, and explain the procedure.  Afterwards, an IV will be started in your arm and electrodes will be placed on your skin for EKG monitoring during the stress portion of the exam. Then you will be escorted to the PET/CT scanner.  There, staff will get you positioned on the scanner and obtain a blood pressure and EKG.  During the exam, you will continue to be connected to the EKG and blood pressure machines.  A small, safe amount of a radioactive tracer will be injected in your IV to obtain a series of pictures of your heart along with an injection of a stress agent.    After your Exam:  It is recommended that you eat a meal and drink a caffeinated beverage to counter act any effects of the stress agent.  Drink plenty of fluids for the remainder of the day and urinate frequently for the first couple of hours after the exam.  Your doctor will inform you of your test results within 7-10 business days.  For more information and frequently asked questions, please visit our website: [email or web address]  For questions about your test or how to prepare for your test, please call: Cardiac Imaging Nurse Navigators Office: [phone]

## 2023-07-11 NOTE — Telephone Encounter (Signed)
Pharmacy Patient Advocate Encounter   Received notification from Physician's Office that prior authorization for repatha is required/requested.   Insurance verification completed.   The patient is insured through [employer] [facility] .   Per test claim: PA required; PA submitted to above mentioned insurance via CoverMyMeds Key/confirmation #/EOC [id number] Status is pending

## 2023-07-13 ENCOUNTER — Other Ambulatory Visit (HOSPITAL_COMMUNITY): Payer: Self-pay

## 2023-07-13 NOTE — Telephone Encounter (Signed)
Pharmacy Patient Advocate Encounter  Received notification from [employer] [facility] that Prior Authorization for repatha has been APPROVED from 07/11/23 to 07/18/23. Ran test claim, Copay is $47.00 one month. This test claim was processed through [facility]- copay amounts may vary at other pharmacies due to pharmacy/plan contracts, or as the patient moves through the different stages of their insurance plan.   PA #/Case ID/Reference #: [id number]

## 2023-07-17 ENCOUNTER — Telehealth: Payer: Self-pay | Admitting: Pharmacy Technician

## 2023-07-17 NOTE — Telephone Encounter (Signed)
Pharmacy Patient Advocate Encounter   Received notification from CoverMyMeds that prior authorization for repatha is required/requested.   Insurance verification completed.   The patient is insured through [employer] [facility] .   Per test claim: PA required; PA submitted to above mentioned insurance via CoverMyMeds Key/confirmation #/EOC [id number] Status is pending

## 2023-07-17 NOTE — Telephone Encounter (Signed)
Pharmacy Patient Advocate Encounter  Received notification from [employer] [facility] that Prior Authorization for repatha has been APPROVED from 07/17/23 to 07/17/24   PA #/Case ID/Reference #: [id number]

## 2023-08-17 ENCOUNTER — Encounter: Payer: Medicare HMO | Admitting: Physical Medicine & Rehabilitation

## 2023-09-04 ENCOUNTER — Telehealth (HOSPITAL_COMMUNITY): Payer: Self-pay | Admitting: Emergency Medicine

## 2023-09-04 NOTE — Telephone Encounter (Signed)
 Reaching out to patient to offer assistance regarding upcoming cardiac imaging study; pt verbalizes understanding of appt date/time, parking situation and where to check in, pre-test NPO status and medications ordered, and verified current allergies; name and call back number provided for further questions should they arise Rockwell Alexandria RN Navigator Cardiac Imaging [facility] [phone] office [phone] cell

## 2023-09-05 ENCOUNTER — Ambulatory Visit (HOSPITAL_COMMUNITY)
Admission: RE | Admit: 2023-09-05 | Discharge: 2023-09-05 | Disposition: A | Discharge status: Home / Self Care | Payer: Medicare HMO | Source: Ambulatory Visit | Attending: Cardiovascular Disease | Admitting: Cardiovascular Disease

## 2023-09-05 DIAGNOSIS — I10 Essential (primary) hypertension: Secondary | ICD-10-CM

## 2023-09-05 DIAGNOSIS — C50011 Malignant neoplasm of nipple and areola, right female breast: Secondary | ICD-10-CM

## 2023-09-05 DIAGNOSIS — E0590 Thyrotoxicosis, unspecified without thyrotoxic crisis or storm: Secondary | ICD-10-CM

## 2023-09-05 DIAGNOSIS — E7801 Familial hypercholesterolemia: Secondary | ICD-10-CM | POA: Diagnosis present

## 2023-09-05 DIAGNOSIS — I25119 Atherosclerotic heart disease of native coronary artery with unspecified angina pectoris: Secondary | ICD-10-CM

## 2023-09-05 DIAGNOSIS — I209 Angina pectoris, unspecified: Secondary | ICD-10-CM

## 2023-09-05 DIAGNOSIS — I2510 Atherosclerotic heart disease of native coronary artery without angina pectoris: Secondary | ICD-10-CM

## 2023-09-05 LAB — NM PET CT CARDIAC PERFUSION MULTI W/ABSOLUTE BLOODFLOW
MBFR: 2.41
Rest MBF: 0.73 ml/g/min
Rest Nuclear Isotope Dose: 18.6 mCi
ST Depression (mm): 0 mm
Stress MBF: 1.76 ml/g/min
Stress Nuclear Isotope Dose: 18.6 mCi
TID: 1.36

## 2023-09-05 MED ORDER — REGADENOSON 0.4 MG/5ML IV SOLN
INTRAVENOUS | Status: AC
Start: 2023-09-05 — End: ?
  Filled 2023-09-05: qty 5

## 2023-09-05 MED ORDER — REGADENOSON 0.4 MG/5ML IV SOLN: 0.4000 mg | Freq: Once | INTRAVENOUS | Status: DC

## 2023-09-05 MED ORDER — RUBIDIUM RB82 GENERATOR (RUBYFILL)
18.6200 | PACK | Freq: Once | INTRAVENOUS | Status: AC
Start: 2023-09-05 — End: 2023-09-05
  Administered 2023-09-05: 18.62 via INTRAVENOUS

## 2023-09-05 MED ORDER — RUBIDIUM RB82 GENERATOR (RUBYFILL)
18.6400 | PACK | Freq: Once | INTRAVENOUS | Status: AC
Start: 2023-09-05 — End: 2023-09-05
  Administered 2023-09-05: 18.64 via INTRAVENOUS

## 2023-09-06 ENCOUNTER — Other Ambulatory Visit (HOSPITAL_COMMUNITY): Payer: Self-pay

## 2023-09-06 ENCOUNTER — Telehealth: Payer: Self-pay

## 2023-09-06 ENCOUNTER — Other Ambulatory Visit: Payer: Self-pay

## 2023-09-06 MED ORDER — NITROGLYCERIN 0.4 MG SL SUBL
0.4000 mg | SUBLINGUAL_TABLET | SUBLINGUAL | 3 refills | Status: AC | PRN
Start: 2023-09-06 — End: ?
  Filled 2023-09-06 (×2): qty 25, 5d supply, fill #0
  Filled 2024-02-23: qty 25, 8d supply, fill #1

## 2023-09-06 NOTE — Telephone Encounter (Signed)
-----   Message from Charlton Haws sent at 09/05/2023  5:10 PM EST ----- Abnormal scan suggesting LAD blockage Need to f/u with me or PA to discuss cath

## 2023-09-06 NOTE — Progress Notes (Signed)
 CARDIOLOGY CONSULT NOTE       Patient ID: Pamela Small MRN: [id number] DOB/AGE: 10/08/1945 78 y.o.   Referring Physician: Manson Passey Primary Physician: Estevan Oaks, NP Primary Cardiologist: Noe Gens For: Chest pain     HPI:  78 y.o. referred by NP Manson Passey for chest pain. First seen 07/11/23 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   Last seen by [place] in 2016 cardiac CTA done 05/11/12  with calcium score 58 which is 79 th percentile  with non obstructive CAD read by radiology as 0-25% in LM. 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 Has palpitations at night and atypical SSCP sharp pain similar to after she takes statin rest/exertion She has palpitations at night when she lays down She indicates "falling out" twice while doing house work Thinks her BS got low. She has had tyroid radioactive iodine TSH has been high and not rechecked since her dose adjusted a couple of months ago  PET CT reviewed:  concern for TID 1.38 with abnormal MBFR in LAD with small area of LAD ischemia. Moderate calcium noted in LAD and RCA   Using nitroglycerin 2 x/ week for pain in chest and dyspnea no rest pain   ROS All other systems reviewed and negative except as noted above  Past Medical History:  Diagnosis Date   Abnormal CT scan    Asthma    Breast cancer (HCC)    Hyperlipidemia    Hypertension    Lung nodules    seen on CT scan 2013   Multiple thyroid nodules    US guided bx was benign in 2013   Narrowing of  intervertebral disc space    Numbness of fingers of both hands     Family History  Problem Relation Age of Onset   Hyperlipidemia Mother    Asthma Mother    Diabetes Maternal Grandmother    Breast cancer Daughter     Social History   Socioeconomic History   Marital status: Married    Spouse name: Not on file   Number of children: Not on file   Years of education: Not on file   Highest education level: Not on file  Occupational History   Not on file  Tobacco Use   Smoking status: Never   Smokeless tobacco: Never  Substance and Sexual Activity   Alcohol use: No   Drug use: No   Sexual activity: Never  Other Topics Concern   Not on file  Social History Narrative   Not on file   Social Drivers of Health   Financial Resource Strain: Low Risk  (06/15/2022)   Overall Financial Resource Strain (CARDIA)    Difficulty of Paying Living Expenses: Not very hard  Food Insecurity: No Food Insecurity (09/06/2022)   Hunger Vital Sign    Worried About Running Out of Food in the Last Year: Never true    Ran Out of Food in  the Last Year: Never true  Transportation Needs: No Transportation Needs (09/06/2022)   PRAPARE - Administrator, Civil Service (Medical): No    Lack of Transportation (Non-Medical): No  Physical Activity: Not on file  Stress: Not on file  Social Connections: Unknown (05/30/2022)   Received from [facility], [facility]   Social Network    Social Network: Not on file  Intimate Partner Violence: Not At Risk (09/06/2022)   Humiliation, Afraid, Rape, and Kick questionnaire    Fear of Current or Ex-Partner: No    Emotionally Abused: No    Physically Abused: No    Sexually Abused: No    Past Surgical History:  Procedure Laterality Date   ABDOMINAL HYSTERECTOMY     BREAST LUMPECTOMY WITH RADIOACTIVE SEED LOCALIZATION Right 08/04/2022   Procedure: RIGHT BREAST LUMPECTOMY WITH RADIOACTIVE SEED LOCALIZATION;  Surgeon: Emelia Loron, MD;  Location:  [facility];  Service: General;  Laterality: Right;   COLONOSCOPY     FOOT ARTHRODESIS, MODIFIED MCBRIDE     POLYPECTOMY        Current Outpatient Medications:    acetaminophen (TYLENOL) 500 MG tablet, Take by mouth., Disp: , Rfl:    albuterol (VENTOLIN HFA) 108 (90 Base) MCG/ACT inhaler, Inhale into the lungs., Disp: , Rfl:    amLODipine (NORVASC) 5 MG tablet, Take 1 tablet (5 mg total) by mouth daily., Disp: 30 tablet, Rfl: 1   atenolol (TENORMIN) 50 MG tablet, Take by mouth., Disp: , Rfl:    Cholecalciferol 50 MCG (2000 UT) TABS, Take by mouth., Disp: , Rfl:    gabapentin (NEURONTIN) 300 MG capsule, Take 300 mg by mouth at bedtime., Disp: , Rfl:    levothyroxine (SYNTHROID) 75 MCG tablet, Take 75 mcg by mouth daily., Disp: , Rfl:    Multiple Vitamin (DAILY VITES) tablet, Take 1 tablet by mouth at bedtime., Disp: , Rfl:    nitroGLYCERIN (NITROSTAT) 0.4 MG SL tablet, Place 1 tablet (0.4 mg total) under the tongue every 5 (five) minutes as needed for chest pain., Disp: 25 tablet, Rfl: 3   B Complex-C (B-COMPLEX WITH VITAMIN C) tablet, Take by mouth. (Patient not taking: Reported on 09/13/2023), Disp: , Rfl:    cyanocobalamin (VITAMIN B12) 1000 MCG tablet, Take by mouth. (Patient not taking: Reported on 09/13/2023), Disp: , Rfl:    Evolocumab (REPATHA) 140 MG/ML SOSY, Inject 140 mg into the skin every 14 (fourteen) days. (Patient not taking: Reported on 09/13/2023), Disp: 2 mL, Rfl: 11   ezetimibe (ZETIA) 10 MG tablet, Take 1 tablet by mouth once daily (Patient not taking: Reported on 09/13/2023), Disp: 90 tablet, Rfl: 0   propylthiouracil (PTU) 50 MG tablet, 1/2 tab every other day (Patient not taking: Reported on 09/13/2023), Disp: , Rfl:    rosuvastatin (CRESTOR) 10 MG tablet, Take 1 tablet (10 mg total) by mouth daily. (Patient not taking: Reported on 09/13/2023), Disp: 90 tablet, Rfl: 3   solifenacin (VESICARE) 5 MG tablet, Take by mouth. (Patient not taking: Reported on  09/13/2023), Disp: , Rfl:    traMADol (ULTRAM) 50 MG tablet, Take 1 tablet (50 mg total) by mouth every 6 (six) hours as needed. (Patient not taking: Reported on 09/13/2023), Disp: 10 tablet, Rfl: 0    Physical Exam: Blood pressure 120/72, pulse 65, height 5' 2.5" (1.588 m), weight 159 lb (72.1 kg), SpO2 97%.    Affect appropriate Healthy:  appears stated age HEENT: normal Neck supple with no adenopathy JVP normal no bruits  no thyromegaly Lungs clear with no wheezing and good diaphragmatic motion Heart:  S1/S2 no murmur, no rub, gallop or click PMI normal prior right breast lumpectomy Abdomen: benighn, BS positve, no tenderness, no AAA no bruit.  No HSM or HJR Distal pulses intact with no bruits No edema Neuro non-focal Skin warm and dry No muscular weakness   Labs:   Lab Results  Component Value Date   WBC 5.2 06/15/2022   HGB 15.0 06/15/2022   HCT 46.3 (H) 06/15/2022   MCV 88.9 06/15/2022   PLT 265 06/15/2022   No results for input(s): "NA", "K", "CL", "CO2", "BUN", "CREATININE", "CALCIUM", "PROT", "BILITOT", "ALKPHOS", "ALT", "AST", "GLUCOSE" in the last 168 hours.  Invalid input(s): "LABALBU" Lab Results  Component Value Date   TROPONINI <0.30 05/10/2012    Lab Results  Component Value Date   CHOL 356 (H) 03/07/2022   CHOL 333 (H) 09/26/2016   CHOL 325 (H) 05/01/2015   Lab Results  Component Value Date   HDL 60 03/07/2022   HDL 53 09/26/2016   HDL 57 05/01/2015   Lab Results  Component Value Date   LDLCALC 282 (H) 03/07/2022   LDLCALC 261 (H) 09/26/2016   LDLCALC 249 (H) 05/01/2015   Lab Results  Component Value Date   TRIG 90 03/07/2022   TRIG 96 09/26/2016   TRIG 93 05/01/2015   Lab Results  Component Value Date   CHOLHDL 5.9 (H) 03/07/2022   CHOLHDL 6.3 (H) 09/26/2016   CHOLHDL 5.7 (H) 05/01/2015   No results found for: "LDLDIRECT"    Radiology: NM PET CT CARDIAC PERFUSION MULTI W/ABSOLUTE BLOODFLOW Result Date: 09/05/2023   Findings are  consistent with small territory LAD ischemia with abnormal stress flow (1.52). The study is intermediate risk due to quantiative TID.   LV perfusion is normal. There is evidence of ischemia. There is no evidence of infarction.   Rest left ventricular function is normal. Stress left ventricular function is normal. End diastolic cavity size is normal. End systolic cavity size is normal.   Myocardial blood flow was computed to be 0.25ml/g/min at rest and 1.29ml/g/min at stress. Global myocardial blood flow reserve was 2.41 and was normal.   Coronary calcium was present on the attenuation correction CT images. Moderate coronary calcifications were present. Coronary calcifications were present in the left anterior descending artery and right coronary artery distribution(s).  Aortic atherosclerosis.   Electronically Signed  By: Riley Lam M.D. CLINICAL DATA:  This over-read does not include interpretation of cardiac or coronary anatomy or pathology. The Cardiac PET CT interpretation by the cardiologist is attached. COMPARISON:  None Available. FINDINGS: Cardiovascular: Aortic atherosclerosis. Normal heart size. Left coronary artery calcifications. No pericardial effusion. Limited Mediastinum/Nodes: No enlarged mediastinal, hilar, or axillary lymph nodes. Small hiatal hernia. Trachea and esophagus demonstrate no significant findings. Limited Lungs/Pleura: Lungs are clear. No pleural effusion or pneumothorax. Upper Abdomen: No acute abnormality. Musculoskeletal: No chest wall abnormality. No acute osseous findings. IMPRESSION: 1. No acute CT findings of the included chest. 2. Coronary artery disease. 3. Small hiatal hernia. Electronically Signed   By: Jearld Lesch M.D.   On: 09/05/2023 10:56  [place] OUTSIDE FILMS BREAST Result Date: 08/24/2023 This examination belongs to an outside facility and is stored here for comparison purposes only.  Contact the originating outside institution for any associated report or  interpretation.  MM Outside Films Mammo Result Date: 08/24/2023 This examination belongs to an outside facility and is stored here for comparison purposes only.  Contact the originating outside institution for any associated report or interpretation.   EKG: SR rate 61 normal 08/02/22     ASSESSMENT AND PLAN:   CAD: noted in 2013 with high calcium score for age. F/U myovue non ischemic in 2016. CRF;s HTN and HLD Now abnormal PET CT with area of LAD ischemia, moderate calcium in LAD/RCA and more worrisome TID 1.38.   Shared Decision Making/Informed Consent The risks [stroke (1 in 1000), death (1 in 1000), kidney failure [usually temporary] (1 in 500), bleeding (1 in 200), allergic reaction [possibly serious] (1 in 200)], benefits (diagnostic support and management of coronary artery disease) and alternatives of a cardiac catheterization were discussed in detail with Pamela Small and she is willing to proceed.    HTN:  continue norvasc and atenolol  HLD:  stopped crestor due to myalgias on Zetia has not picked up repatha yet she needs to do this and f/u with lipid clinic  Thyroid:  on synthroid replacement with history goiter TSH was 42 03/07/22 Check labs today  Breast Cancer:  f/u Dr Dwain Sarna. Right lumpectomy 07/2022 stage 1 declined endocrine/XRT Rx f/u mammogram per oncology   Pre cath labs [facility] cath lab called/schedule Orders written  3/6 with DR Clifton James   F/U f/u post cath    Signed: Charlton Haws 09/13/2023, 11:08 AM

## 2023-09-06 NOTE — Telephone Encounter (Signed)
Called patient and made her an appointment to see Dr. Eden Emms and sent in nitroglycerin prn to take for chest pain.

## 2023-09-06 NOTE — H&P (View-Only) (Signed)
 CARDIOLOGY CONSULT NOTE       Patient ID: Pamela Small MRN: [id number] DOB/AGE: 10/08/1945 78 y.o.   Referring Physician: Manson Passey Primary Physician: Estevan Oaks, NP Primary Cardiologist: Noe Gens For: Chest pain     HPI:  78 y.o. referred by NP Manson Passey for chest pain. First seen 07/11/23 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   Last seen by [place] in 2016 cardiac CTA done 05/11/12  with calcium score 58 which is 79 th percentile  with non obstructive CAD read by radiology as 0-25% in LM. 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 Has palpitations at night and atypical SSCP sharp pain similar to after she takes statin rest/exertion She has palpitations at night when she lays down She indicates "falling out" twice while doing house work Thinks her BS got low. She has had tyroid radioactive iodine TSH has been high and not rechecked since her dose adjusted a couple of months ago  PET CT reviewed:  concern for TID 1.38 with abnormal MBFR in LAD with small area of LAD ischemia. Moderate calcium noted in LAD and RCA   Using nitroglycerin 2 x/ week for pain in chest and dyspnea no rest pain   ROS All other systems reviewed and negative except as noted above  Past Medical History:  Diagnosis Date   Abnormal CT scan    Asthma    Breast cancer (HCC)    Hyperlipidemia    Hypertension    Lung nodules    seen on CT scan 2013   Multiple thyroid nodules    US guided bx was benign in 2013   Narrowing of  intervertebral disc space    Numbness of fingers of both hands     Family History  Problem Relation Age of Onset   Hyperlipidemia Mother    Asthma Mother    Diabetes Maternal Grandmother    Breast cancer Daughter     Social History   Socioeconomic History   Marital status: Married    Spouse name: Not on file   Number of children: Not on file   Years of education: Not on file   Highest education level: Not on file  Occupational History   Not on file  Tobacco Use   Smoking status: Never   Smokeless tobacco: Never  Substance and Sexual Activity   Alcohol use: No   Drug use: No   Sexual activity: Never  Other Topics Concern   Not on file  Social History Narrative   Not on file   Social Drivers of Health   Financial Resource Strain: Low Risk  (06/15/2022)   Overall Financial Resource Strain (CARDIA)    Difficulty of Paying Living Expenses: Not very hard  Food Insecurity: No Food Insecurity (09/06/2022)   Hunger Vital Sign    Worried About Running Out of Food in the Last Year: Never true    Ran Out of Food in  the Last Year: Never true  Transportation Needs: No Transportation Needs (09/06/2022)   PRAPARE - Administrator, Civil Service (Medical): No    Lack of Transportation (Non-Medical): No  Physical Activity: Not on file  Stress: Not on file  Social Connections: Unknown (05/30/2022)   Received from [facility], [facility]   Social Network    Social Network: Not on file  Intimate Partner Violence: Not At Risk (09/06/2022)   Humiliation, Afraid, Rape, and Kick questionnaire    Fear of Current or Ex-Partner: No    Emotionally Abused: No    Physically Abused: No    Sexually Abused: No    Past Surgical History:  Procedure Laterality Date   ABDOMINAL HYSTERECTOMY     BREAST LUMPECTOMY WITH RADIOACTIVE SEED LOCALIZATION Right 08/04/2022   Procedure: RIGHT BREAST LUMPECTOMY WITH RADIOACTIVE SEED LOCALIZATION;  Surgeon: Emelia Loron, MD;  Location:  [facility];  Service: General;  Laterality: Right;   COLONOSCOPY     FOOT ARTHRODESIS, MODIFIED MCBRIDE     POLYPECTOMY        Current Outpatient Medications:    acetaminophen (TYLENOL) 500 MG tablet, Take by mouth., Disp: , Rfl:    albuterol (VENTOLIN HFA) 108 (90 Base) MCG/ACT inhaler, Inhale into the lungs., Disp: , Rfl:    amLODipine (NORVASC) 5 MG tablet, Take 1 tablet (5 mg total) by mouth daily., Disp: 30 tablet, Rfl: 1   atenolol (TENORMIN) 50 MG tablet, Take by mouth., Disp: , Rfl:    Cholecalciferol 50 MCG (2000 UT) TABS, Take by mouth., Disp: , Rfl:    gabapentin (NEURONTIN) 300 MG capsule, Take 300 mg by mouth at bedtime., Disp: , Rfl:    levothyroxine (SYNTHROID) 75 MCG tablet, Take 75 mcg by mouth daily., Disp: , Rfl:    Multiple Vitamin (DAILY VITES) tablet, Take 1 tablet by mouth at bedtime., Disp: , Rfl:    nitroGLYCERIN (NITROSTAT) 0.4 MG SL tablet, Place 1 tablet (0.4 mg total) under the tongue every 5 (five) minutes as needed for chest pain., Disp: 25 tablet, Rfl: 3   B Complex-C (B-COMPLEX WITH VITAMIN C) tablet, Take by mouth. (Patient not taking: Reported on 09/13/2023), Disp: , Rfl:    cyanocobalamin (VITAMIN B12) 1000 MCG tablet, Take by mouth. (Patient not taking: Reported on 09/13/2023), Disp: , Rfl:    Evolocumab (REPATHA) 140 MG/ML SOSY, Inject 140 mg into the skin every 14 (fourteen) days. (Patient not taking: Reported on 09/13/2023), Disp: 2 mL, Rfl: 11   ezetimibe (ZETIA) 10 MG tablet, Take 1 tablet by mouth once daily (Patient not taking: Reported on 09/13/2023), Disp: 90 tablet, Rfl: 0   propylthiouracil (PTU) 50 MG tablet, 1/2 tab every other day (Patient not taking: Reported on 09/13/2023), Disp: , Rfl:    rosuvastatin (CRESTOR) 10 MG tablet, Take 1 tablet (10 mg total) by mouth daily. (Patient not taking: Reported on 09/13/2023), Disp: 90 tablet, Rfl: 3   solifenacin (VESICARE) 5 MG tablet, Take by mouth. (Patient not taking: Reported on  09/13/2023), Disp: , Rfl:    traMADol (ULTRAM) 50 MG tablet, Take 1 tablet (50 mg total) by mouth every 6 (six) hours as needed. (Patient not taking: Reported on 09/13/2023), Disp: 10 tablet, Rfl: 0    Physical Exam: Blood pressure 120/72, pulse 65, height 5' 2.5" (1.588 m), weight 159 lb (72.1 kg), SpO2 97%.    Affect appropriate Healthy:  appears stated age HEENT: normal Neck supple with no adenopathy JVP normal no bruits  no thyromegaly Lungs clear with no wheezing and good diaphragmatic motion Heart:  S1/S2 no murmur, no rub, gallop or click PMI normal prior right breast lumpectomy Abdomen: benighn, BS positve, no tenderness, no AAA no bruit.  No HSM or HJR Distal pulses intact with no bruits No edema Neuro non-focal Skin warm and dry No muscular weakness   Labs:   Lab Results  Component Value Date   WBC 5.2 06/15/2022   HGB 15.0 06/15/2022   HCT 46.3 (H) 06/15/2022   MCV 88.9 06/15/2022   PLT 265 06/15/2022   No results for input(s): "NA", "K", "CL", "CO2", "BUN", "CREATININE", "CALCIUM", "PROT", "BILITOT", "ALKPHOS", "ALT", "AST", "GLUCOSE" in the last 168 hours.  Invalid input(s): "LABALBU" Lab Results  Component Value Date   TROPONINI <0.30 05/10/2012    Lab Results  Component Value Date   CHOL 356 (H) 03/07/2022   CHOL 333 (H) 09/26/2016   CHOL 325 (H) 05/01/2015   Lab Results  Component Value Date   HDL 60 03/07/2022   HDL 53 09/26/2016   HDL 57 05/01/2015   Lab Results  Component Value Date   LDLCALC 282 (H) 03/07/2022   LDLCALC 261 (H) 09/26/2016   LDLCALC 249 (H) 05/01/2015   Lab Results  Component Value Date   TRIG 90 03/07/2022   TRIG 96 09/26/2016   TRIG 93 05/01/2015   Lab Results  Component Value Date   CHOLHDL 5.9 (H) 03/07/2022   CHOLHDL 6.3 (H) 09/26/2016   CHOLHDL 5.7 (H) 05/01/2015   No results found for: "LDLDIRECT"    Radiology: NM PET CT CARDIAC PERFUSION MULTI W/ABSOLUTE BLOODFLOW Result Date: 09/05/2023   Findings are  consistent with small territory LAD ischemia with abnormal stress flow (1.52). The study is intermediate risk due to quantiative TID.   LV perfusion is normal. There is evidence of ischemia. There is no evidence of infarction.   Rest left ventricular function is normal. Stress left ventricular function is normal. End diastolic cavity size is normal. End systolic cavity size is normal.   Myocardial blood flow was computed to be 0.25ml/g/min at rest and 1.29ml/g/min at stress. Global myocardial blood flow reserve was 2.41 and was normal.   Coronary calcium was present on the attenuation correction CT images. Moderate coronary calcifications were present. Coronary calcifications were present in the left anterior descending artery and right coronary artery distribution(s).  Aortic atherosclerosis.   Electronically Signed  By: Riley Lam M.D. CLINICAL DATA:  This over-read does not include interpretation of cardiac or coronary anatomy or pathology. The Cardiac PET CT interpretation by the cardiologist is attached. COMPARISON:  None Available. FINDINGS: Cardiovascular: Aortic atherosclerosis. Normal heart size. Left coronary artery calcifications. No pericardial effusion. Limited Mediastinum/Nodes: No enlarged mediastinal, hilar, or axillary lymph nodes. Small hiatal hernia. Trachea and esophagus demonstrate no significant findings. Limited Lungs/Pleura: Lungs are clear. No pleural effusion or pneumothorax. Upper Abdomen: No acute abnormality. Musculoskeletal: No chest wall abnormality. No acute osseous findings. IMPRESSION: 1. No acute CT findings of the included chest. 2. Coronary artery disease. 3. Small hiatal hernia. Electronically Signed   By: Jearld Lesch M.D.   On: 09/05/2023 10:56  [place] OUTSIDE FILMS BREAST Result Date: 08/24/2023 This examination belongs to an outside facility and is stored here for comparison purposes only.  Contact the originating outside institution for any associated report or  interpretation.  MM Outside Films Mammo Result Date: 08/24/2023 This examination belongs to an outside facility and is stored here for comparison purposes only.  Contact the originating outside institution for any associated report or interpretation.   EKG: SR rate 61 normal 08/02/22     ASSESSMENT AND PLAN:   CAD: noted in 2013 with high calcium score for age. F/U myovue non ischemic in 2016. CRF;s HTN and HLD Now abnormal PET CT with area of LAD ischemia, moderate calcium in LAD/RCA and more worrisome TID 1.38.   Shared Decision Making/Informed Consent The risks [stroke (1 in 1000), death (1 in 1000), kidney failure [usually temporary] (1 in 500), bleeding (1 in 200), allergic reaction [possibly serious] (1 in 200)], benefits (diagnostic support and management of coronary artery disease) and alternatives of a cardiac catheterization were discussed in detail with Pamela Small and she is willing to proceed.    HTN:  continue norvasc and atenolol  HLD:  stopped crestor due to myalgias on Zetia has not picked up repatha yet she needs to do this and f/u with lipid clinic  Thyroid:  on synthroid replacement with history goiter TSH was 42 03/07/22 Check labs today  Breast Cancer:  f/u Dr Dwain Sarna. Right lumpectomy 07/2022 stage 1 declined endocrine/XRT Rx f/u mammogram per oncology   Pre cath labs [facility] cath lab called/schedule Orders written  3/6 with DR Clifton James   F/U f/u post cath    Signed: Charlton Haws 09/13/2023, 11:08 AM

## 2023-09-13 ENCOUNTER — Other Ambulatory Visit: Payer: Self-pay | Admitting: Cardiovascular Disease

## 2023-09-13 ENCOUNTER — Other Ambulatory Visit: Payer: Self-pay

## 2023-09-13 ENCOUNTER — Telehealth: Payer: Self-pay | Admitting: Cardiovascular Disease

## 2023-09-13 ENCOUNTER — Other Ambulatory Visit (HOSPITAL_COMMUNITY): Payer: Self-pay

## 2023-09-13 ENCOUNTER — Ambulatory Visit: Payer: Medicare HMO | Attending: Cardiovascular Disease | Admitting: Cardiovascular Disease

## 2023-09-13 ENCOUNTER — Encounter: Payer: Self-pay | Admitting: Cardiovascular Disease

## 2023-09-13 VITALS — BP 120/72 | HR 65 | Ht 62.5 in | Wt 159.0 lb

## 2023-09-13 DIAGNOSIS — R9439 Abnormal result of other cardiovascular function study: Secondary | ICD-10-CM

## 2023-09-13 DIAGNOSIS — E7801 Familial hypercholesterolemia: Secondary | ICD-10-CM

## 2023-09-13 DIAGNOSIS — I10 Essential (primary) hypertension: Secondary | ICD-10-CM

## 2023-09-13 DIAGNOSIS — I209 Angina pectoris, unspecified: Secondary | ICD-10-CM | POA: Diagnosis not present

## 2023-09-13 DIAGNOSIS — E0590 Thyrotoxicosis, unspecified without thyrotoxic crisis or storm: Secondary | ICD-10-CM

## 2023-09-13 DIAGNOSIS — E785 Hyperlipidemia, unspecified: Secondary | ICD-10-CM

## 2023-09-13 MED ORDER — REPATHA SURECLICK 140 MG/ML ~~LOC~~ SOAJ
140.0000 mg | SUBCUTANEOUS | 3 refills | Status: DC
  Filled 2023-09-13: qty 6, 84d supply, fill #0
  Filled 2023-09-15 (×2): qty 2, 28d supply, fill #0

## 2023-09-13 MED ORDER — REPATHA 140 MG/ML ~~LOC~~ SOSY: 140.0000 mg | PREFILLED_SYRINGE | SUBCUTANEOUS | 3 refills | Status: DC

## 2023-09-13 NOTE — Telephone Encounter (Signed)
 Left message for patient to call back

## 2023-09-13 NOTE — Telephone Encounter (Signed)
 Pt calling back said she has changed her mind and does not want to be put to sleep for her upcoming cath. Pt wants a c/b from the nurse.

## 2023-09-13 NOTE — Addendum Note (Signed)
 Addended by: Virl Axe, Azlin Zilberman L on: 09/13/2023 01:36 PM   Modules accepted: Orders

## 2023-09-13 NOTE — Addendum Note (Signed)
 Addended by: Virl Axe, Tarquin Welcher L on: 09/13/2023 11:40 AM   Modules accepted: Orders

## 2023-09-13 NOTE — Patient Instructions (Addendum)
 Medication Instructions:  Your physician recommends that you continue on your current medications as directed. Please refer to the Current Medication list given to you today.  *If you need a refill on your cardiac medications before your next appointment, please call your pharmacy*  Lab Work: If you have labs (blood work) drawn today and your tests are completely normal, you will receive your results only by: MyChart Message (if you have MyChart) OR A paper copy in the mail If you have any lab test that is abnormal or we need to change your treatment, we will call you to review the results.  Testing/Procedures: Your physician has requested that you have a cardiac catheterization. Cardiac catheterization is used to diagnose and/or treat various heart conditions. Doctors may recommend this procedure for a number of different reasons. The most common reason is to evaluate chest pain. Chest pain can be a symptom of coronary artery disease (CAD), and cardiac catheterization can show whether plaque is narrowing or blocking your heart's arteries. This procedure is also used to evaluate the valves, as well as measure the blood flow and oxygen levels in different parts of your heart. For further information please visit [email or web address] Please follow instruction sheet, as given.  Follow-Up: At [facility], you and your health needs are our priority.  As part of our continuing mission to provide you with exceptional heart care, we have created designated Provider Care Teams.  These Care Teams include your primary Cardiologist (physician) and Advanced Practice Providers (APPs -  Physician Assistants and Nurse Practitioners) who all work together to provide you with the care you need, when you need it.  We recommend signing up for the patient portal called "MyChart".  Sign up information is provided on this After Visit Summary.  MyChart is used to connect with patients for Virtual Visits  (Telemedicine).  Patients are able to view lab/test results, encounter notes, upcoming appointments, etc.  Non-urgent messages can be sent to your provider as well.   To learn more about what you can do with MyChart, go to [email or web address].    Your next appointment:   3 week(s)  Provider:   Jari Favre, PA-C, Ronie Spies, PA-C, Robin Searing, NP, Jacolyn Reedy, PA-C, Eligha Bridegroom, NP, Tereso Newcomer, PA-C, or Perlie Gold, PA-C     Then, Charlton Haws, MD will plan to see you again in 6 month(s).    You have been referred to Lipid Clinic. Other Instructions  [facility] A DEPT OF [place] AT [facility] [address] [place], [place] [address] [place] [address] Dept: [phone] Loc: [phone]  AMYE GREGO  09/13/2023  You are scheduled for a Cardiac Catheterization on Wednesday, March 5 with Dr. Verne Carrow.  1. Please arrive at the [facility] (Main Entrance A) at [facility]: [address] [place], [place] [address] at 8:00 AM (This time is 2 hour(s) before your procedure to ensure your preparation).   Free valet parking service is available. You will check in at ADMITTING. The support person will be asked to wait in the waiting room.  It is OK to have someone drop you off and come back when you are ready to be discharged.    Special note: Every effort is made to have your procedure done on time. Please understand that emergencies sometimes delay scheduled procedures.  2. Diet: Do not eat solid foods after midnight.  The patient may have clear liquids until 5am  upon the day of the procedure.  3. Labs: You will need to have blood drawn today- BMET and CBC  4. Medication instructions in preparation for your procedure:   Contrast Allergy: No  On the morning of your procedure, take your Aspirin 81 mg and any morning medicines NOT listed above.  You may use sips of water.  5. Plan to go home the  same day, you will only stay overnight if medically necessary. 6. Bring a current list of your medications and current insurance cards. 7. You MUST have a responsible person to drive you home. 8. Someone MUST be with you the first 24 hours after you arrive home or your discharge will be delayed. 9. Please wear clothes that are easy to get on and off and wear slip-on shoes.  Thank you for allowing [place] to care for you!   -- [facility]       1st Floor: - Lobby - Registration  - Pharmacy  - Lab - Cafe  2nd Floor: - PV Lab - Diagnostic Testing (echo, CT, nuclear med)  3rd Floor: - Vacant  4th Floor: - TCTS (cardiothoracic surgery) - AFib Clinic - Structural Heart Clinic - Vascular Surgery  - Vascular Ultrasound  5th Floor: - HeartCare Cardiology (general and EP) - Clinical Pharmacy for coumadin, hypertension, lipid, weight-loss medications, and med management appointments    Valet parking services will be available as well.

## 2023-09-14 LAB — BASIC METABOLIC PANEL
BUN/Creatinine Ratio: 18 (ref 12–28)
BUN: 18 mg/dL (ref 8–27)
CO2: 24 mmol/L (ref 20–29)
Calcium: 9.4 mg/dL (ref 8.7–10.3)
Chloride: 101 mmol/L (ref 96–106)
Creatinine, Ser: 1.02 mg/dL — ABNORMAL HIGH (ref 0.57–1.00)
Glucose: 101 mg/dL — ABNORMAL HIGH (ref 70–99)
Potassium: 4.7 mmol/L (ref 3.5–5.2)
Sodium: 139 mmol/L (ref 134–144)
eGFR: 57 mL/min/{1.73_m2} — ABNORMAL LOW (ref >59)

## 2023-09-14 LAB — CBC WITH DIFFERENTIAL/PLATELET
Basophils Absolute: 0 10*3/uL (ref 0.0–0.2)
Basos: 1 %
EOS (ABSOLUTE): 0.1 10*3/uL (ref 0.0–0.4)
Eos: 2 %
Hematocrit: 46.5 % (ref 34.0–46.6)
Hemoglobin: 14.7 g/dL (ref 11.1–15.9)
Immature Grans (Abs): 0 10*3/uL (ref 0.0–0.1)
Immature Granulocytes: 0 %
Lymphocytes Absolute: 2.4 10*3/uL (ref 0.7–3.1)
Lymphs: 37 %
MCH: 28.4 pg (ref 26.6–33.0)
MCHC: 31.6 g/dL (ref 31.5–35.7)
MCV: 90 fL (ref 79–97)
Monocytes Absolute: 0.3 10*3/uL (ref 0.1–0.9)
Monocytes: 5 %
Neutrophils Absolute: 3.5 10*3/uL (ref 1.4–7.0)
Neutrophils: 55 %
Platelets: 263 10*3/uL (ref 150–450)
RBC: 5.18 x10E6/uL (ref 3.77–5.28)
RDW: 13.3 % (ref 11.7–15.4)
WBC: 6.4 10*3/uL (ref 3.4–10.8)

## 2023-09-14 NOTE — Telephone Encounter (Signed)
 Spoke with pt and advised per [facility] no concerns with Repatha and Thyroid cancer.  Pt verbalizes understanding and thanked Charity fundraiser for the call.

## 2023-09-14 NOTE — Telephone Encounter (Signed)
 There is no concern with Repatha and thyroid cancer

## 2023-09-14 NOTE — Telephone Encounter (Signed)
 Patient is returning phone call.  Patient mentioned that she had thyroid cancer in the past and would like to know if she should start the repatha medication.

## 2023-09-15 ENCOUNTER — Other Ambulatory Visit: Payer: Self-pay | Admitting: Family Medicine

## 2023-09-15 ENCOUNTER — Other Ambulatory Visit (HOSPITAL_COMMUNITY): Payer: Self-pay

## 2023-09-18 ENCOUNTER — Telehealth: Payer: Self-pay | Admitting: Cardiovascular Disease

## 2023-09-18 ENCOUNTER — Other Ambulatory Visit (HOSPITAL_COMMUNITY): Payer: Self-pay

## 2023-09-18 NOTE — Telephone Encounter (Signed)
 Patient called stating she missed a call from [place], she was returning the call. She stated she has procedure come up on Wednesday, she is not sure if the call was about that.

## 2023-09-18 NOTE — Telephone Encounter (Signed)
 Cardiac Catheterization scheduled at [facility] for: Wednesday September 20, 2023 10 AM Arrival time Mid - [facility] Main Entrance A at: 8 AM  Nothing to eat after midnight prior to procedure, clear liquids until 5 AM day of procedure.  Medication instructions: -Usual morning medications can be taken with sips of water including aspirin 81 mg.  Plan to go home the same day, you will only stay overnight if medically necessary.  You must have responsible adult to drive you home.  Someone must be with you the first 24 hours after you arrive home.  Reviewed procedure instructions with patient.

## 2023-09-20 ENCOUNTER — Ambulatory Visit (HOSPITAL_COMMUNITY)
Admission: RE | Admit: 2023-09-20 | Discharge: 2023-09-20 | Disposition: A | Discharge status: Home / Self Care | Payer: Medicare HMO | Attending: Cardiovascular Disease | Admitting: Cardiovascular Disease

## 2023-09-20 DIAGNOSIS — Z539 Procedure and treatment not carried out, unspecified reason: Secondary | ICD-10-CM | POA: Insufficient documentation

## 2023-09-20 DIAGNOSIS — R9439 Abnormal result of other cardiovascular function study: Secondary | ICD-10-CM

## 2023-09-20 DIAGNOSIS — R931 Abnormal findings on diagnostic imaging of heart and coronary circulation: Secondary | ICD-10-CM | POA: Insufficient documentation

## 2023-09-20 SURGERY — LEFT HEART CATH AND CORONARY ANGIOGRAPHY
Anesthesia: LOCAL

## 2023-09-20 MED ORDER — SODIUM CHLORIDE 0.9 % WEIGHT BASED INFUSION: 1.0000 mL/kg/h | INTRAVENOUS | Status: DC

## 2023-09-20 MED ORDER — SODIUM CHLORIDE 0.9 % WEIGHT BASED INFUSION: 3.0000 mL/kg/h | INTRAVENOUS | Status: AC

## 2023-09-20 MED ORDER — ASPIRIN 81 MG PO CHEW: 81.0000 mg | CHEWABLE_TABLET | ORAL | Status: DC

## 2023-09-21 ENCOUNTER — Telehealth: Payer: Self-pay | Admitting: *Deleted

## 2023-09-21 NOTE — Telephone Encounter (Signed)
 Call placed to patient to review procedure instructions for cardiac cath 09/25/23. Patient tells me transportation at discharge will be a problem 09/25/23 if she is same day discharge. Patient requests to reschedule to Thursday 09/28/23-this has been done and instructions review with patient.  Cardiac Catheterization scheduled at [facility] for: Thursday September 28, 2023 1:30 PM Arrival time [facility] Main Entrance A at: 11 :30 AM  Nothing to eat after midnight prior to procedure, clear liquids until 5 AM day of procedure.  Medication instructions: -Usual morning medications can be taken with sips of water including aspirin 81 mg.  Plan to go home the same day, you will only stay overnight if medically necessary.  You must have responsible adult to drive you home.  Someone must be with you the first 24 hours after you arrive home.  Reviewed procedure instructions with patient.

## 2023-09-27 ENCOUNTER — Other Ambulatory Visit (HOSPITAL_COMMUNITY): Payer: Self-pay

## 2023-09-27 ENCOUNTER — Telehealth: Payer: Self-pay | Admitting: *Deleted

## 2023-09-27 NOTE — Telephone Encounter (Signed)
 Cardiac Catheterization scheduled at [facility] for: Thursday September 28, 2023 1:30 PM Arrival time [facility] Main Entrance A at: 11:30 AM  Nothing to eat after midnight prior to procedure, clear liquids until 5 AM day of procedure.  Medication instructions: -Usual morning medications can be taken with sips of water including aspirin 81 mg.  Plan to go home the same day, you will only stay overnight if medically necessary.  You must have responsible adult to drive you home.  Someone must be with you the first 24 hours after you arrive home.  Reviewed procedure instructions with patient.

## 2023-09-28 ENCOUNTER — Ambulatory Visit (HOSPITAL_COMMUNITY)
Admission: RE | Admit: 2023-09-28 | Discharge: 2023-09-28 | Disposition: A | Discharge status: Home / Self Care | Attending: Cardiovascular Disease | Admitting: Cardiovascular Disease

## 2023-09-28 ENCOUNTER — Other Ambulatory Visit: Payer: Self-pay

## 2023-09-28 ENCOUNTER — Encounter (HOSPITAL_COMMUNITY)
Admission: RE | Disposition: A | Discharge status: Home / Self Care | Payer: Self-pay | Source: Home / Self Care | Attending: Cardiovascular Disease

## 2023-09-28 DIAGNOSIS — R9439 Abnormal result of other cardiovascular function study: Secondary | ICD-10-CM | POA: Insufficient documentation

## 2023-09-28 DIAGNOSIS — E785 Hyperlipidemia, unspecified: Secondary | ICD-10-CM | POA: Diagnosis not present

## 2023-09-28 DIAGNOSIS — Z79890 Hormone replacement therapy (postmenopausal): Secondary | ICD-10-CM | POA: Diagnosis not present

## 2023-09-28 DIAGNOSIS — I2510 Atherosclerotic heart disease of native coronary artery without angina pectoris: Secondary | ICD-10-CM | POA: Diagnosis not present

## 2023-09-28 DIAGNOSIS — C50919 Malignant neoplasm of unspecified site of unspecified female breast: Secondary | ICD-10-CM | POA: Insufficient documentation

## 2023-09-28 DIAGNOSIS — Z79899 Other long term (current) drug therapy: Secondary | ICD-10-CM | POA: Insufficient documentation

## 2023-09-28 HISTORY — PX: LEFT HEART CATH AND CORONARY ANGIOGRAPHY: CATH118249

## 2023-09-28 SURGERY — LEFT HEART CATH AND CORONARY ANGIOGRAPHY
Anesthesia: LOCAL

## 2023-09-28 MED ORDER — VERAPAMIL HCL 2.5 MG/ML IV SOLN
INTRAVENOUS | Status: DC | PRN
  Administered 2023-09-28: 10 mL via INTRA_ARTERIAL

## 2023-09-28 MED ORDER — MIDAZOLAM HCL 2 MG/2ML IJ SOLN
INTRAMUSCULAR | Status: AC
  Filled 2023-09-28: qty 2

## 2023-09-28 MED ORDER — SODIUM CHLORIDE 0.9 % WEIGHT BASED INFUSION
3.0000 mL/kg/h | INTRAVENOUS | Status: AC
  Administered 2023-09-28: 3 mL/kg/h via INTRAVENOUS

## 2023-09-28 MED ORDER — FENTANYL CITRATE (PF) 100 MCG/2ML IJ SOLN
INTRAMUSCULAR | Status: DC | PRN
  Administered 2023-09-28: 25 ug via INTRAVENOUS

## 2023-09-28 MED ORDER — SODIUM CHLORIDE 0.9 % WEIGHT BASED INFUSION: 1.0000 mL/kg/h | INTRAVENOUS | Status: DC

## 2023-09-28 MED ORDER — HEPARIN (PORCINE) IN NACL 1000-0.9 UT/500ML-% IV SOLN
INTRAVENOUS | Status: DC | PRN
  Administered 2023-09-28 (×2): 500 mL via INTRA_ARTERIAL

## 2023-09-28 MED ORDER — IOHEXOL 350 MG/ML SOLN
INTRAVENOUS | Status: DC | PRN
  Administered 2023-09-28: 40 mL via INTRA_ARTERIAL

## 2023-09-28 MED ORDER — LIDOCAINE HCL (PF) 1 % IJ SOLN
INTRAMUSCULAR | Status: DC | PRN
  Administered 2023-09-28: 2 mL

## 2023-09-28 MED ORDER — MIDAZOLAM HCL 2 MG/2ML IJ SOLN
INTRAMUSCULAR | Status: DC | PRN
  Administered 2023-09-28: 1 mg via INTRAVENOUS

## 2023-09-28 MED ORDER — VERAPAMIL HCL 2.5 MG/ML IV SOLN
INTRAVENOUS | Status: AC
  Filled 2023-09-28: qty 2

## 2023-09-28 MED ORDER — HEPARIN SODIUM (PORCINE) 1000 UNIT/ML IJ SOLN
INTRAMUSCULAR | Status: DC | PRN
  Administered 2023-09-28: 4000 [IU] via INTRAVENOUS

## 2023-09-28 MED ORDER — FENTANYL CITRATE (PF) 100 MCG/2ML IJ SOLN
INTRAMUSCULAR | Status: AC
  Filled 2023-09-28: qty 2

## 2023-09-28 MED ORDER — LIDOCAINE HCL (PF) 1 % IJ SOLN
INTRAMUSCULAR | Status: AC
  Filled 2023-09-28: qty 30

## 2023-09-28 MED ORDER — HEPARIN SODIUM (PORCINE) 1000 UNIT/ML IJ SOLN
INTRAMUSCULAR | Status: AC
  Filled 2023-09-28: qty 10

## 2023-09-28 MED ORDER — ASPIRIN 81 MG PO CHEW: 81.0000 mg | CHEWABLE_TABLET | ORAL | Status: DC

## 2023-09-28 SURGICAL SUPPLY — 8 items
CATH 5FR JL3.5 JR4 ANG PIG MP (CATHETERS) IMPLANT
DEVICE RAD COMP TR BAND LRG (VASCULAR PRODUCTS) IMPLANT
GLIDESHEATH SLEND SS 6F .021 (SHEATH) IMPLANT
GUIDEWIRE INQWIRE 1.5J.035X260 (WIRE) IMPLANT
INQWIRE 1.5J .035X260CM (WIRE) ×1 IMPLANT
KIT SINGLE USE MANIFOLD (KITS) IMPLANT
PACK CARDIAC CATHETERIZATION (CUSTOM PROCEDURE TRAY) ×1 IMPLANT
SET ATX-X65L (MISCELLANEOUS) IMPLANT

## 2023-09-28 NOTE — Progress Notes (Signed)
 TR band removed at 1535, gauze dressing applied. Right radial level 0, clean, dry, and intact. Patient walked to the bathroom without difficulties

## 2023-09-28 NOTE — Interval H&P Note (Signed)
 History and Physical Interval Note:  09/28/2023 11:36 AM  Pamela Small  has presented today for surgery, with the diagnosis of abnormal myoview.  The various methods of treatment have been discussed with the patient and family. After consideration of risks, benefits and other options for treatment, the patient has consented to  Procedure(s): LEFT HEART CATH AND CORONARY ANGIOGRAPHY (N/A) as a surgical intervention.  The patient's history has been reviewed, patient examined, no change in status, stable for surgery.  I have reviewed the patient's chart and labs.  Questions were answered to the patient's satisfaction.    Cath Lab Visit (complete for each Cath Lab visit)  Clinical Evaluation Leading to the Procedure:   ACS: No.  Non-ACS:    Anginal Classification: CCS II  Anti-ischemic medical therapy: Maximal Therapy (2 or more classes of medications)  Non-Invasive Test Results: Intermediate-risk stress test findings: cardiac mortality 1-3%/year  Prior CABG: No previous CABG   Verne Carrow

## 2023-09-28 NOTE — Discharge Instructions (Addendum)
 Start Aspirin 81 mg daily  Drink plenty of fluids for 48 hours and keep wrist elevated at heart level for 24 hours  Radial Site Care   This sheet gives you information about how to care for yourself after your procedure. Your health care provider may also give you more specific instructions. If you have problems or questions, contact your health care provider. What can I expect after the procedure? After the procedure, it is common to have: Bruising and tenderness at the catheter insertion area. Follow these instructions at home: Medicines Take over-the-counter and prescription medicines only as told by your health care provider. Insertion site care Follow instructions from your health care provider about how to take care of your insertion site. Make sure you: Wash your hands with soap and water before you change your bandage (dressing). If soap and water are not available, use hand sanitizer. Remove your dressing as told by your health care provider. In 24 hours Check your insertion site every day for signs of infection. Check for: Redness, swelling, or pain. Fluid or blood. Pus or a bad smell. Warmth. Do not take baths, swim, or use a hot tub until your health care provider approves. You may shower 24-48 hours after the procedure, or as directed by your health care provider. Remove the dressing and gently wash the site with plain soap and water. Pat the area dry with a clean towel. Do not rub the site. That could cause bleeding. Do not apply powder or lotion to the site. Activity   For 24 hours after the procedure, or as directed by your health care provider: Do not flex or bend the affected arm. Do not push or pull heavy objects with the affected arm. Do not drive yourself home from the hospital or clinic. You may drive 24 hours after the procedure unless your health care provider tells you not to. Do not operate machinery or power tools. Do not lift anything that is heavier  than 10 lb (4.5 kg), or the limit that you are told, until your health care provider says that it is safe.  For 4 days Ask your health care provider when it is okay to: Return to work or school. Resume usual physical activities or sports. Resume sexual activity. General instructions If the catheter site starts to bleed, raise your arm and put firm pressure on the site. If the bleeding does not stop, get help right away. This is a medical emergency. If you went home on the same day as your procedure, a responsible adult should be with you for the first 24 hours after you arrive home. Keep all follow-up visits as told by your health care provider. This is important. Contact a health care provider if: You have a fever. You have redness, swelling, or yellow drainage around your insertion site. Get help right away if: You have unusual pain at the radial site. The catheter insertion area swells very fast. The insertion area is bleeding, and the bleeding does not stop when you hold steady pressure on the area. Your arm or hand becomes pale, cool, tingly, or numb. These symptoms may represent a serious problem that is an emergency. Do not wait to see if the symptoms will go away. Get medical help right away. Call your local emergency services (911 in the [place] Do not drive yourself to the hospital. Summary After the procedure, it is common to have bruising and tenderness at the site. Follow instructions from your health care provider about  how to take care of your radial site wound. Check the wound every day for signs of infection. Do not lift anything that is heavier than 10 lb (4.5 kg), or the limit that you are told, until your health care provider says that it is safe. This information is not intended to replace advice given to you by your health care provider. Make sure you discuss any questions you have with your health care provider. Document Revised: 08/09/2017 Document Reviewed:  08/09/2017 Elsevier Patient Education  2020 [employer].

## 2023-09-29 ENCOUNTER — Encounter (HOSPITAL_COMMUNITY): Payer: Self-pay | Admitting: Cardiovascular Disease

## 2023-10-09 ENCOUNTER — Encounter: Payer: Self-pay | Admitting: Physician Assistant

## 2023-10-09 DIAGNOSIS — I2510 Atherosclerotic heart disease of native coronary artery without angina pectoris: Secondary | ICD-10-CM

## 2023-10-09 HISTORY — DX: Atherosclerotic heart disease of native coronary artery without angina pectoris: I25.10

## 2023-10-09 NOTE — Progress Notes (Unsigned)
 Cardiology Office Note:    Date:  10/10/2023  ID:  [name], DOB [birth year], MRN [id number] PCP: Estevan Oaks, NP  [facility] Providers Cardiologist:  Charlton Haws, MD       Patient Profile:      Coronary artery disease  CT 04/2012: CAC score 58 (79th percentile), nonobstructive CAD  TTE 10/14/14: EF 60-65, no RWMA, mild MR, PASP 36  Lex MPI 10/28/14: no ischemia  PET MPI 09/05/23: small territory of LAD ischemia, +TID, global MBFR normal LHC 09/28/23: oLM 20, mLM 20; oLAD 50 followed by aneurysmal segment, mLAD 30 >> Med Rx; consider FFR of oLAD if refractory angina  Breast CA s/p lumpectomy Asthma  Hypertension  Hyperlipidemia  Statin induced myalgias  Cervical spine stenosis          Discussed the use of AI scribe software for clinical note transcription with the patient, who gave verbal consent to proceed.  History of Present Illness Pamela Small is a 78 y.o. female who returns for follow up after a recent cardiac catheterization. She was seen by Dr. Eden Emms 09/13/23 for chest pain. She had recently had a Stress PET that was abnormal. Cardiac catheterization was arranged. 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 It was difficult to visualize but appeared to be eccentric and not flow limiting. The films were reviewed with interventional cardiology team. Med Rx was recommended with ASA and Ranolazine vs Imdur. It was recommended to consider FFR of oLAD if she has refractory angina despite OMT.  She is here alone. She experiences occasional palpitations, which occur infrequently and are often associated with overexertion. She describes episodes of heart racing, particularly at night, which resolve with atenolol. She has not worn a heart monitor, and these episodes do not occur frequently enough to warrant monitoring.  She notes some chest heaviness with lifting heavy objects.  She notes  shortness of breath with exertion, such as walking, but no chest heaviness during these activities. She uses albuterol for relief. She can walk a couple of blocks without stopping and can climb stairs without significant issues. No leg swelling, but she mentions a sensation of swelling without actual edema. She has not taken Repatha yet due to cost and lack of instruction on administration.  ROS-See HPI    Studies Reviewed:        Results    Risk Assessment/Calculations:             Physical Exam:   VS:  BP 124/76   Pulse 68   Ht 5' 2.5" (1.588 m)   Wt 159 lb 12.8 oz (72.5 kg)   SpO2 97%   BMI 28.76 kg/m    Wt Readings from Last 3 Encounters:  10/10/23 159 lb 12.8 oz (72.5 kg)  09/28/23 160 lb (72.6 kg)  09/20/23 160 lb (72.6 kg)    Constitutional:      Appearance: Healthy appearance. Not in distress.  Neck:     Vascular: No carotid bruit. JVD normal.  Pulmonary:     Breath sounds: Normal breath sounds. No wheezing. No rales.  Cardiovascular:     Normal rate. Regular rhythm.     Murmurs: There is no murmur.  Edema:    Peripheral edema absent.        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 It was difficult to visualize but appeared to be eccentric and not flow limiting. The films were reviewed with interventional cardiology team. Med Rx was recommended with ASA and Ranolazine vs Imdur. It was recommended to consider FFR of oLAD if she has refractory angina despite OMT. - Continue amlodipine 10 mg daily - Start Imdur 15 mg daily - Continue aspirin 81 mg daily - Continue nitroglycerin PRN - Follow-up in 6 months - Consider repeat catheterization with FFR of the LAD if refractory angina despite optimal medical therapy Primary hypertension Blood pressure is  well-controlled with current medication regimen. Recent readings improved after dietary changes. Emphasized accurate home monitoring with an arm cuff for reliable readings. - Continue amlodipine 10 mg daily Pure hypercholesterolemia Significantly elevated LDL previously, intolerant to statins due to myalgias. Currently on Zetia 10 mg daily. Prior authorization for Repatha completed with a $47 monthly copay. She is aware of the copay. - Continue Zetia 10 mg daily - Send Repatha 140 mg every 2-weeks prescription to [facility] - Arrange follow-up in lipid clinic to discuss administration - Order CMET and fasting lipids 3 months after starting Repatha Palpitations Occasional palpitations, not frequent enough for an event monitor. Discussed home monitoring options with Kardia device if palpitations increase. A 14-day ZOXT monitor may be considered for more frequent palpitations.     Dispo:  Return in about 6 months (around 04/11/2024) for Routine Follow Up, w/ Dr. Eden Emms.  Signed, Tereso Newcomer, PA-C

## 2023-10-10 ENCOUNTER — Ambulatory Visit: Payer: Medicare HMO | Attending: Physician Assistant | Admitting: Physician Assistant

## 2023-10-10 ENCOUNTER — Encounter: Payer: Self-pay | Admitting: Physician Assistant

## 2023-10-10 ENCOUNTER — Other Ambulatory Visit: Payer: Self-pay | Admitting: *Deleted

## 2023-10-10 VITALS — BP 124/76 | HR 68 | Ht 62.5 in | Wt 159.8 lb

## 2023-10-10 DIAGNOSIS — I2510 Atherosclerotic heart disease of native coronary artery without angina pectoris: Secondary | ICD-10-CM

## 2023-10-10 DIAGNOSIS — I10 Essential (primary) hypertension: Secondary | ICD-10-CM

## 2023-10-10 DIAGNOSIS — E7800 Pure hypercholesterolemia, unspecified: Secondary | ICD-10-CM | POA: Diagnosis not present

## 2023-10-10 DIAGNOSIS — R002 Palpitations: Secondary | ICD-10-CM

## 2023-10-10 MED ORDER — ISOSORBIDE MONONITRATE ER 30 MG PO TB24: 15.0000 mg | ORAL_TABLET | Freq: Every day | ORAL | 3 refills | Status: AC

## 2023-10-10 MED ORDER — REPATHA SURECLICK 140 MG/ML ~~LOC~~ SOAJ: 140.0000 mg | SUBCUTANEOUS | 3 refills | Status: DC

## 2023-10-10 NOTE — Assessment & Plan Note (Signed)
 Blood pressure is well-controlled with current medication regimen. Recent readings improved after dietary changes. Emphasized accurate home monitoring with an arm cuff for reliable readings. - Continue amlodipine 10 mg daily

## 2023-10-10 NOTE — Assessment & Plan Note (Signed)
 Significantly elevated LDL previously, intolerant to statins due to myalgias. Currently on Zetia 10 mg daily. Prior authorization for Repatha completed with a $47 monthly copay. She is aware of the copay. - Continue Zetia 10 mg daily - Send Repatha 140 mg every 2-weeks prescription to [facility] - Arrange follow-up in lipid clinic to discuss administration - Order CMET and fasting lipids 3 months after starting Repatha

## 2023-10-10 NOTE — Assessment & Plan Note (Signed)
 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 It was difficult to visualize but appeared to be eccentric and not flow limiting. The films were reviewed with interventional cardiology team. Med Rx was recommended with ASA and Ranolazine vs Imdur. It was recommended to consider FFR of oLAD if she has refractory angina despite OMT. - Continue amlodipine 10 mg daily - Start Imdur 15 mg daily - Continue aspirin 81 mg daily - Continue nitroglycerin PRN - Follow-up in 6 months - Consider repeat catheterization with FFR of the LAD if refractory angina despite optimal medical therapy

## 2023-10-10 NOTE — Patient Instructions (Addendum)
 Medication Instructions:  Your physician has recommended you make the following change in your medication:   START Isosorbide 30 mg taking 1/2 tablet daily  *If you need a refill on your cardiac medications before your next appointment, please call your pharmacy*   Lab Work: 3 MONTHS AFTER YOU START REPATHA, YOU WILL NEED FASTING LABS.  YOU CAN GO TO ANY [facility] NEAR YOU FOR:  CMET & LIPID  [facility] locations:   [facility] - [address] ([address] [place] [employer] office) - [address] ([facility]) - [address] [place]  - [phone]   [employer] [facility] At [facility] [address]    [place]  - [address]    [place] - [address] - [address] [employer]  - [address] [place] - [address] Chief Technology Officer)  If you have labs (blood work) drawn today and your tests are completely normal, you will receive your results only by: [employer] (if you have MyChart) OR A paper copy in the mail If you have any lab test that is abnormal or we need to change your treatment, we will call you to review the results.   Testing/Procedures: None ordered   Follow-Up: At [facility], you and your health needs are our priority.  As part of our continuing mission to provide you with exceptional heart care, we have created designated Provider Care Teams.  These Care Teams include your primary Cardiologist (physician) and Advanced Practice Providers (APPs -  Physician Assistants and Nurse Practitioners) who all work together to provide you with the care you need, when you need it.  We recommend signing up for the patient portal called "MyChart".  Sign up information is provided on this After Visit Summary.  MyChart is used to connect with patients for Virtual Visits (Telemedicine).  Patients are able to view lab/test results, encounter notes, upcoming appointments,  etc.  Non-urgent messages can be sent to your provider as well.   To learn more about what you can do with MyChart, go to [email or web address].    Your next appointment:   6 month(s)  Provider:   Charlton Haws, MD  or Tereso Newcomer, PA-C         Other Instructions WHEN YOU GET THE REPATHA, CALL HERE AND MOVE YOUR APPOINTMENT UP WITH THE PHARMACISTS SO THEY CAN GIVE YOU SOME EDUCATION ON HOW TO ADMINISTER THE MEDICATION    1st Floor: - Lobby - Registration  - Pharmacy  - Lab - Cafe  2nd Floor: - PV Lab - Diagnostic Testing (echo, CT, nuclear med)  3rd Floor: - Vacant  4th Floor: - TCTS (cardiothoracic surgery) - AFib Clinic - Structural Heart Clinic - Vascular Surgery  - Vascular Ultrasound  5th Floor: - HeartCare Cardiology (general and EP) - Clinical Pharmacy for coumadin, hypertension, lipid, weight-loss medications, and med management appointments    Valet parking services will be available as well.

## 2023-10-30 ENCOUNTER — Telehealth: Payer: Self-pay | Admitting: Pharmacist

## 2023-10-30 NOTE — Telephone Encounter (Signed)
 Received VM from scheduler that patient was canceling her apt with me tomorrow bc she didn't have the medication. It looks like Rx was sent in for Repatha, but we have never seen pt in clinic. She requested a call back. I called pt and LVM for her to call back.

## 2023-10-30 NOTE — Telephone Encounter (Signed)
 Spoke to patient, she will keep her apt tomorrow

## 2023-10-31 ENCOUNTER — Other Ambulatory Visit (HOSPITAL_COMMUNITY): Payer: Self-pay

## 2023-10-31 ENCOUNTER — Ambulatory Visit: Payer: Medicare HMO | Admitting: Pharmacist

## 2023-10-31 ENCOUNTER — Ambulatory Visit: Attending: Cardiology | Admitting: Pharmacist

## 2023-10-31 ENCOUNTER — Telehealth: Payer: Self-pay | Admitting: Pharmacy Technician

## 2023-10-31 ENCOUNTER — Encounter: Payer: Self-pay | Admitting: Pharmacy Technician

## 2023-10-31 DIAGNOSIS — E7801 Familial hypercholesterolemia: Secondary | ICD-10-CM | POA: Diagnosis not present

## 2023-10-31 MED ORDER — REPATHA SURECLICK 140 MG/ML ~~LOC~~ SOAJ
1.0000 mL | SUBCUTANEOUS | 11 refills | Status: AC
  Filled 2023-10-31: qty 2, 28d supply, fill #0
  Filled 2023-11-14 – 2023-11-24 (×2): qty 2, 28d supply, fill #1
  Filled 2024-01-15: qty 2, 28d supply, fill #2
  Filled 2024-02-23: qty 2, 28d supply, fill #3
  Filled 2024-04-04: qty 2, 28d supply, fill #4
  Filled 2024-05-29: qty 2, 28d supply, fill #5

## 2023-10-31 NOTE — Progress Notes (Signed)
 Patient ID: ARAH ARO                 DOB: 1946-04-07                    MRN: [id number]      HPI: Pamela Small is a 78 y.o. female patient referred to lipid clinic by Dr. Eden Emms . PMH is significant for CAD CAC score 58 (2013), LHC 09/28/23 oLM 20, mLM 20; oLAD 50 followed by aneurysmal segment, mLAD 30, angina, HTN, HLD (familiar), statin induced myalgias, post ablative hypothyroidism and cervical pine stenosis.   She had recently had a Stress PET that was abnormal. Cardiac catheterization was arranged. 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 It was difficult to visualize but appeared to be eccentric and not flow limiting. The films were reviewed with interventional cardiology team. Med Rx was recommended with ASA and Ranolazine vs Imdur. It was recommended to consider FFR of oLAD if she has refractory angina despite OMT.  Patient presents today to lipid clinic.  She never was able to get her Repatha even though it was approved in December, sounds like she has a high deductible. LDL-C in August of 2023 was 282.  I suspect a genetic component to her hyperlipidemia.  She does report previous intake of high fat content which she is working to decrease.  Per discussion it appears patient may not take all of her medications consistently.  We talked about the importance of taking her medications consistently except her atenolol which she uses just as needed for palpitations.  Has not taken her Synthroid on the last few days, explained the importance of her Synthroid on the regulation of her thyroid hormone which is important for her cholesterol.   Reviewed PCSK-9 inhibitors. Discussed mechanisms of action, dosing, side effects and potential decreases in LDL cholesterol.  Also reviewed cost information and potential options for patient assistance.   Current Medications: ezetimibe Intolerances: rosuvastatin 40,20,10,  lovastatin 10 (leg cramps) Risk Factors: CAD, familial hyperlipidemia LDL-C goal: <70 ApoB goal: <80  Diet: was eating a lot of fried foods and fatty meat but has changed Decreased bacon Some butter in gritts and rice   Exercise: was walking but stopped  Family History:  Family History  Problem Relation Age of Onset   Hyperlipidemia Mother    Asthma Mother    Diabetes Maternal Grandmother    Breast cancer Daughter      Social History:  Social History   Socioeconomic History   Marital status: Married    Spouse name: Not on file   Number of children: Not on file   Years of education: Not on file   Highest education level: Not on file  Occupational History   Not on file  Tobacco Use   Smoking status: Never   Smokeless tobacco: Never  Substance and Sexual Activity   Alcohol use: No   Drug use: No   Sexual activity: Never  Other Topics Concern   Not on file  Social History Narrative   Not on file   Social Drivers of Health   Financial Resource Strain: Low Risk  (06/15/2022)   Overall Financial Resource Strain (CARDIA)    Difficulty of Paying Living Expenses: Not very hard  Food Insecurity: No Food Insecurity (09/06/2022)   Hunger Vital Sign    Worried About Running Out of Food in the Last Year: Never true  Ran Out of Food in the Last Year: Never true  Transportation Needs: No Transportation Needs (09/06/2022)   PRAPARE - Administrator, Civil Service (Medical): No    Lack of Transportation (Non-Medical): No  Physical Activity: Not on file  Stress: Not on file  Social Connections: Unknown (05/30/2022)   [facility] from [facility], [facility]   Social Network    Social Network: Not on file  Intimate Partner Violence: Not At Risk (09/06/2022)   Humiliation, Afraid, Rape, and Kick questionnaire    Fear of Current or Ex-Partner: No    Emotionally Abused: No    Physically Abused: No    Sexually Abused: No     Labs: Lipid Panel      Component Value Date/Time   CHOL 356 (H) 03/07/2022 1026   TRIG 90 03/07/2022 1026   HDL 60 03/07/2022 1026   CHOLHDL 5.9 (H) 03/07/2022 1026   CHOLHDL 5.7 (H) 05/01/2015 1528   VLDL 19 05/01/2015 1528   LDLCALC 282 (H) 03/07/2022 1026   LABVLDL 14 03/07/2022 1026    Past Medical History:  Diagnosis Date   Abnormal CT scan    Asthma    Breast cancer (HCC)    CAD (coronary artery disease) 10/09/2023   CT 04/2012: CAC score 58 (79th percentile), nonobstructive CAD  TTE 10/14/14: EF 60-65, no RWMA, mild MR, PASP 36  Lex MPI 10/28/14: no ischemia  PET MPI 09/05/23: small territory of LAD ischemia, +TID, global MBFR normal LHC 09/28/23: oLM 20, mLM 20; oLAD 50 followed by aneurysmal segment, mLAD 30 >> Med Rx; consider FFR of oLAD if refractory angina     Hyperlipidemia    Hypertension    Lung nodules    seen on CT scan 2013   Multiple thyroid nodules    US guided bx was benign in 2013   Narrowing of intervertebral disc space    Numbness of fingers of both hands     Current Outpatient Medications on File Prior to Visit  Medication Sig Dispense Refill   isosorbide mononitrate (IMDUR) 30 MG 24 hr tablet Take 0.5 tablets (15 mg total) by mouth daily. 45 tablet 3   albuterol (VENTOLIN HFA) 108 (90 Base) MCG/ACT inhaler Inhale 2 puffs into the lungs every 6 (six) hours as needed for wheezing or shortness of breath.     amLODipine (NORVASC) 10 MG tablet Take 10 mg by mouth daily.     aspirin EC 81 MG tablet Take 81 mg by mouth daily. Swallow whole.     atenolol (TENORMIN) 50 MG tablet Take 50 mg by mouth daily as needed (AFIB).     Evolocumab (REPATHA SURECLICK) 140 MG/ML SOAJ Inject 140 mg into the skin every 14 (fourteen) days. 6 mL 3   ezetimibe (ZETIA) 10 MG tablet Take 1 tablet by mouth once daily 90 tablet 0   levothyroxine (SYNTHROID) 88 MCG tablet Take 88 mcg by mouth daily before breakfast.     Multiple Vitamins-Minerals (MULTIVITAMIN WOMEN) TABS Take 1 tablet by mouth daily.      nitroGLYCERIN (NITROSTAT) 0.4 MG SL tablet Place 1 tablet (0.4 mg total) under the tongue every 5 (five) minutes as needed for chest pain. 25 tablet 3   No current facility-administered medications on file prior to visit.    Allergies  Allergen Reactions   Guaifenesin Nausea And Vomiting    Assessment/Plan:  1. Hyperlipidemia -  Familial hypercholesterolemia Assessment: LDL-C is very elevated-likely familial hyperlipidemia Intolerant to rosuvastatin and lovastatin  due to leg cramps On ezetimibe currently Goal LDL-C less than 70 Previously consumed a lot of fried foods and fatty meats, she is working to decrease this significantly-we talked about things to avoid or limit  Plan: Prior authorization for Repatha already approved in December Will get patient healthwell grant to cover cost-will have patient have a gets added to [employer] Patient fills at [facility] Repeat labs in 3 months Stressed the importance of taking her medications consistently Decrease saturated fat, handout given    Thank you,  Lilyann Gravelle D Obera Stauch, Pharm.D, BCACP, CPP [facility] of [facility] [address] [place], [place] [address]  Phone: [phone]; Fax: [phone]

## 2023-10-31 NOTE — Assessment & Plan Note (Deleted)
 Current Medications: ezetimibe Intolerances: rosuvastatin 40,20,10, lovastatin 10 (leg cramps) Risk Factors: CAD, familial hyperlipidemia LDL-C goal: <70 ApoB goal: <80

## 2023-10-31 NOTE — Addendum Note (Signed)
 Addended by: Filomena Pokorney D on: 10/31/2023 04:41 PM   Modules accepted: Orders

## 2023-10-31 NOTE — Telephone Encounter (Signed)
 Patient Advocate Encounter   The patient was approved for a [employer] grant that will help cover the cost of repatha Total amount awarded, 2500.  Effective: 10/01/23 - 09/29/24   NFA:[id number] VHQ:[id number] [id number] [employer] ID: [id number]   Pharmacy provided with approval and processing information. Patient informed via mychart

## 2023-10-31 NOTE — Patient Instructions (Signed)
 Please decrease your intake of saturated fats  We will get you a grant for the Repatha and send the prescription to [facility]

## 2023-10-31 NOTE — Assessment & Plan Note (Addendum)
 Assessment: LDL-C is very elevated-likely familial hyperlipidemia Intolerant to rosuvastatin and lovastatin due to leg cramps On ezetimibe currently Goal LDL-C less than 70 Previously consumed a lot of fried foods and fatty meats, she is working to decrease this significantly-we talked about things to avoid or limit  Plan: Prior authorization for Repatha already approved in December Will get patient healthwell grant to cover cost-will have patient have a gets added to [employer] Patient fills at [facility] Repeat labs in 3 months Stressed the importance of taking her medications consistently Decrease saturated fat, handout given

## 2023-10-31 NOTE — Telephone Encounter (Signed)
Patient made aware via telephone.

## 2023-11-01 ENCOUNTER — Other Ambulatory Visit (HOSPITAL_COMMUNITY): Payer: Self-pay

## 2023-11-02 ENCOUNTER — Other Ambulatory Visit (HOSPITAL_COMMUNITY): Payer: Self-pay

## 2023-11-02 MED ORDER — LEVOTHYROXINE SODIUM 100 MCG PO TABS
100.0000 ug | ORAL_TABLET | Freq: Every morning | ORAL | 3 refills | Status: DC
  Filled 2023-11-02 – 2023-11-24 (×3): qty 90, 90d supply, fill #0
  Filled ????-??-??: fill #0

## 2023-11-02 MED ORDER — METRONIDAZOLE 500 MG PO TABS
500.0000 mg | ORAL_TABLET | Freq: Two times a day (BID) | ORAL | 0 refills | Status: AC
  Filled 2023-11-02: qty 14, 7d supply, fill #0

## 2023-11-06 ENCOUNTER — Other Ambulatory Visit (HOSPITAL_COMMUNITY): Payer: Self-pay

## 2023-11-14 ENCOUNTER — Other Ambulatory Visit (HOSPITAL_COMMUNITY): Payer: Self-pay

## 2023-11-24 ENCOUNTER — Other Ambulatory Visit (HOSPITAL_COMMUNITY): Payer: Self-pay

## 2023-12-21 NOTE — Progress Notes (Unsigned)
 New Patient Evaluation and Consultation  Referring Provider: Carolyn Cisco, NP PCP: Carolyn Cisco, NP Date of Service: 12/22/2023  SUBJECTIVE Chief Complaint: No chief complaint on file.  History of Present Illness: Pamela Small is a 78 y.o. {ED SANE NWGN:56213} female seen in consultation at the request of NP Brown for evaluation of pelvic organ prolapse.    Difficulty using vaginal syringe due to something blocking it Dyspareunia and sensation of tissue "hanging" since hysterectomy Yellow vaginal discharge with standing or lifting Overactive bladder, tried oxybutynin and mirabegron 25mg , evaluated by Dr. 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 Breast pain managed by Dr. Delane Fear at [facility] HTN, angina s/p cardiac cath 09/28/23, elevated liver enzymes, pre-DM, postablative hypothyroidism  Urinary Symptoms: {urine leakage?:24754} Leaks *** time(s) per {days/wks/mos/yrs:310907}.  Pad use: {NUMBERS 1-10:18281} {pad option:24752} per day.   Patient {ACTION; IS/IS YQM:57846962} bothered by UI symptoms.  Day time voids ***.  Nocturia: *** times per night to void. Voiding dysfunction:  {empties:24755} bladder well.  Patient {DOES NOT does:27190::"does not"} use a catheter to empty bladder.  When urinating, patient feels {urine symptoms:24756} Drinks: *** per day  UTIs: {NUMBERS 1-10:18281} UTI's in the last year.   {ACTIONS;DENIES/REPORTS:21021675::"Denies"} history of {urologic concerns:24757} No results found for the last 90 days.   Pelvic Organ Prolapse Symptoms:                  Patient {denies/ admits to:24761} a feeling of a bulge the vaginal area. It has been present for {NUMBER 1-10:22536} {days/wks/mos/yrs:310907}.  Patient {denies/ admits to:24761} seeing a bulge.  This bulge {ACTION; IS/IS XBM:84132440} bothersome.  Bowel  Symptom: Bowel movements: *** time(s) per {Time; day/week/month:13537} Stool consistency: {stool consistency:24758} Straining: {yes/no:19897}.  Splinting: {yes/no:19897}.  Incomplete evacuation: {yes/no:19897}.  Patient {denies/ admits to:24761} accidental bowel leakage / fecal incontinence  Occurs: *** time(s) per {Time; day/week/month:13537}  Consistency with leakage: {stool consistency:24758} Bowel regimen: {bowel regimen:24759} Last colonoscopy: Date ***, Results *** [facility] Colonoscopy          Completed or No Longer Recommended     Colonoscopy  Discontinued      Frequency changed to Never automatically (Topic No Longer Applies)   12/06/2010  Done   Only the first 1 history entries have been loaded, but more history exists.                Sexual Function Sexually active: {yes/no:19897}.  Sexual orientation: {Sexual Orientation:[phone]} Pain with sex: {pain with sex:24762}  Pelvic Pain {denies/ admits to:24761} pelvic pain Location: *** Pain occurs: *** Prior pain treatment: *** Improved by: *** Worsened by: ***   Past Medical History:  Past Medical History:  Diagnosis Date   Abnormal CT scan    Asthma    Breast cancer (HCC)    CAD (coronary artery disease) 10/09/2023   CT 04/2012: CAC score 58 (79th percentile), nonobstructive CAD  TTE 10/14/14: EF 60-65, no RWMA, mild MR, PASP 36  Lex MPI 10/28/14: no ischemia  PET MPI 09/05/23: small territory of LAD ischemia, +TID, global MBFR normal LHC 09/28/23: oLM 20, mLM 20; oLAD 50 followed by aneurysmal segment, mLAD 30 >> Med Rx; consider FFR of oLAD if refractory angina     Hyperlipidemia    Hypertension    Lung nodules    seen on CT scan 2013   Multiple thyroid  nodules    US  guided bx was benign in  2013   Narrowing of intervertebral disc space    Numbness of fingers of both hands      Past Surgical History:   Past Surgical History:  Procedure Laterality Date   ABDOMINAL HYSTERECTOMY     BREAST  LUMPECTOMY WITH RADIOACTIVE SEED LOCALIZATION Right 08/04/2022   Procedure: RIGHT BREAST LUMPECTOMY WITH RADIOACTIVE SEED LOCALIZATION;  Surgeon: Enid Harry, MD;  Location: [facility];  Service: General;  Laterality: Right;   COLONOSCOPY     FOOT ARTHRODESIS, MODIFIED [facility]     LEFT HEART CATH AND CORONARY ANGIOGRAPHY N/A 09/28/2023   Procedure: LEFT HEART CATH AND CORONARY ANGIOGRAPHY;  Surgeon: Odie Benne, MD;  Location: [facility];  Service: Cardiovascular;  Laterality: N/A;   POLYPECTOMY       Past OB/GYN History: OB History  No obstetric history on file.    Vaginal deliveries: ***,  Forceps/ Vacuum deliveries: ***, Cesarean section: *** Menopausal: {menopausal:24763} Contraception: ***. Last pap smear was ***.  Any history of abnormal pap smears: {yes/no:19897}. No results found for: "DIAGPAP", "HPVHIGH", "ADEQPAP"  Medications: Patient has a current medication list which includes the following prescription(s): albuterol , amlodipine , aspirin  ec, atenolol, repatha  sureclick, ezetimibe , isosorbide  mononitrate, levothyroxine , levothyroxine , multivitamin women, and nitroglycerin .   Allergies: Patient is allergic to guaifenesin.   Social History:  Social History   Tobacco Use   Smoking status: Never   Smokeless tobacco: Never  Substance Use Topics   Alcohol use: No   Drug use: No    Relationship status: {relationship status:24764} Patient lives with ***.   Patient {ACTION; IS/IS ZOX:09604540} employed ***. Regular exercise: {Yes/No:304960894} History of abuse: {Yes/No:304960894}  Family History:   Family History  Problem Relation Age of Onset   Hyperlipidemia Mother    Asthma Mother    Diabetes Maternal Grandmother    Breast cancer Daughter      Review of Systems: ROS   OBJECTIVE Physical Exam: There were no vitals filed for this visit.  Physical Exam Constitutional:      General: She is not in acute distress.     Appearance: Normal appearance.  Genitourinary:     Bladder and urethral meatus normal.     No lesions in the vagina.     Right Labia: No rash, tenderness, lesions, skin changes or Bartholin's cyst.    Left Labia: No tenderness, lesions, skin changes, Bartholin's cyst or rash.    No vaginal discharge, erythema, tenderness, bleeding, ulceration or granulation tissue.      Right Adnexa: not tender, not full and no mass present.    Left Adnexa: not tender, not full and no mass present.    Cervix is absent.     Uterus is absent.     Urethral meatus caruncle not present.    No urethral prolapse, tenderness, mass, hypermobility or discharge present.     Bladder is not tender, urgency on palpation not present and masses not present.      Levator ani not tender, obturator internus not tender, no asymmetrical contractions present and no pelvic spasms present.    Anal wink present and BC reflex present. Cardiovascular:     Rate and Rhythm: Normal rate.  Pulmonary:     Effort: Pulmonary effort is normal. No respiratory distress.  Abdominal:     General: There is no distension.     Palpations: There is no mass.     Tenderness: There is no abdominal tenderness.     Hernia: No hernia is present.  Neurological:     Mental Status: She is alert.  Vitals reviewed. Exam conducted with a chaperone present.      POP-Q:   POP-Q                                               Aa                                               Ba                                                 C                                                Gh                                               Pb                                               tvl                                                Ap                                               Bp                                                 D      Rectal Exam:  Normal sphincter tone, {rectocele:24766} distal rectocele, enterocoele {DESC; PRESENT/NOT  PRESENT:21021351}, no rectal masses, {sign of:24767} dyssynergia when asking the patient to bear down.  Post-Void Residual (PVR) by Bladder Scan: In order to evaluate bladder emptying, we discussed obtaining a postvoid residual and patient agreed to this procedure.  Procedure: The ultrasound unit was placed on the patient's abdomen in the suprapubic region after the patient had voided.      Laboratory Results: Lab Results  Component Value Date   COLORU yellow 09/26/2016   CLARITYU clear 09/26/2016   GLUCOSEUR negative 09/26/2016   BILIRUBINUR negative 09/26/2016   KETONESU neg 10/10/2014   SPECGRAV 1.015 09/26/2016   RBCUR negative 09/26/2016   PHUR 5.5 09/26/2016   PROTEINUR negative 09/26/2016   UROBILINOGEN 0.2 09/26/2016   LEUKOCYTESUR  small (1+) (A) 09/26/2016    Lab Results  Component Value Date   CREATININE 1.02 (H) 09/13/2023   CREATININE 1.21 (H) 06/15/2022   CREATININE 0.93 03/07/2022    Lab Results  Component Value Date   HGBA1C 6.0 (H) 09/26/2016    Lab Results  Component Value Date   HGB 14.7 09/13/2023   Cr 0.85 on 10/27/23  TSH 57, free T4 0.7   ASSESSMENT AND PLAN Ms. Katona is a 78 y.o. with: No diagnosis found.  There are no diagnoses linked to this encounter.   Darlene Ehlers, MD

## 2023-12-22 ENCOUNTER — Encounter: Payer: Self-pay | Admitting: Obstetrics

## 2023-12-22 ENCOUNTER — Other Ambulatory Visit (HOSPITAL_COMMUNITY): Payer: Self-pay

## 2023-12-22 ENCOUNTER — Other Ambulatory Visit (HOSPITAL_COMMUNITY)
Admission: RE | Admit: 2023-12-22 | Discharge: 2023-12-22 | Disposition: A | Discharge status: Home / Self Care | Source: Other Acute Inpatient Hospital | Attending: Obstetrics | Admitting: Obstetrics

## 2023-12-22 ENCOUNTER — Ambulatory Visit: Admitting: Obstetrics

## 2023-12-22 ENCOUNTER — Other Ambulatory Visit (HOSPITAL_COMMUNITY)
Admission: RE | Admit: 2023-12-22 | Discharge: 2023-12-22 | Disposition: A | Discharge status: Home / Self Care | Source: Ambulatory Visit | Attending: Obstetrics | Admitting: Obstetrics

## 2023-12-22 VITALS — BP 126/68 | HR 69 | Ht 61.0 in | Wt 157.0 lb

## 2023-12-22 DIAGNOSIS — R351 Nocturia: Secondary | ICD-10-CM | POA: Diagnosis not present

## 2023-12-22 DIAGNOSIS — N952 Postmenopausal atrophic vaginitis: Secondary | ICD-10-CM

## 2023-12-22 DIAGNOSIS — R102 Pelvic and perineal pain: Secondary | ICD-10-CM

## 2023-12-22 DIAGNOSIS — N3946 Mixed incontinence: Secondary | ICD-10-CM

## 2023-12-22 DIAGNOSIS — K5900 Constipation, unspecified: Secondary | ICD-10-CM | POA: Insufficient documentation

## 2023-12-22 DIAGNOSIS — R82998 Other abnormal findings in urine: Secondary | ICD-10-CM | POA: Insufficient documentation

## 2023-12-22 DIAGNOSIS — R3914 Feeling of incomplete bladder emptying: Secondary | ICD-10-CM

## 2023-12-22 DIAGNOSIS — N362 Urethral caruncle: Secondary | ICD-10-CM

## 2023-12-22 LAB — POCT URINALYSIS DIP (CLINITEK)
Bilirubin, UA: NEGATIVE
Blood, UA: NEGATIVE
Glucose, UA: NEGATIVE mg/dL
Ketones, POC UA: NEGATIVE mg/dL
Nitrite, UA: NEGATIVE
POC PROTEIN,UA: NEGATIVE
Spec Grav, UA: 1.025 (ref 1.010–1.025)
Urobilinogen, UA: 0.2 U/dL
pH, UA: 5.5 (ref 5.0–8.0)

## 2023-12-22 MED ORDER — GEMTESA 75 MG PO TABS
75.0000 mg | ORAL_TABLET | Freq: Every day | ORAL | 2 refills | Status: AC
  Filled 2023-12-22: qty 30, 30d supply, fill #0

## 2023-12-22 MED ORDER — GEMTESA 75 MG PO TABS: 75.0000 mg | ORAL_TABLET | Freq: Every day | ORAL | Status: DC

## 2023-12-22 MED ORDER — ESTRADIOL 0.1 MG/GM VA CREA
TOPICAL_CREAM | VAGINAL | 3 refills | Status: AC
  Filled 2023-12-22 (×2): qty 42.5, 90d supply, fill #0

## 2023-12-22 NOTE — Assessment & Plan Note (Addendum)
-   pending Nuswab to r/o infectious etiology due to prior relief after course of flagyl  - minimal discharge noted on exam - start low dose vaginal estrogen - significant pain with light touch to pelvic floor muscles - The origin of pelvic floor muscle spasm can be multifactorial, including primary, reactive to a different pain source, trauma, or even part of a centralized pain syndrome.Treatment options include pelvic floor physical therapy, local (vaginal) or oral  muscle relaxants, pelvic muscle trigger point injections or centrally acting pain medications.   - encouraged pelvic floor relaxation and consider pelvic floor PT. Patient to call if she desires referral, declined at this time - We discussed the potential risks associated with hormone replacement including stroke, heart attack, and blood clots; and the fact that these risks are very low with vaginal estrogen use due to the very low systemic absorption rate of ~ 0.01% with a twice-week regimen. - discussed proper vulvar care, warm compression, avoid pad use, cotton only underwear and barrier ointment if needed

## 2023-12-22 NOTE — Assessment & Plan Note (Signed)
-   history of hypothyroidism with 50% hard Type II stool and sensation of incomplete emptying - For constipation, we reviewed the importance of a better bowel regimen.  We also discussed the importance of avoiding chronic straining, as it can exacerbate her pelvic floor symptoms; we discussed treating constipation and straining prior to surgery, as postoperative straining can lead to damage to the repair and recurrence of symptoms. We discussed initiating therapy with increasing fluid intake, fiber supplementation, stool softeners, and laxatives such as miralax .  - encouraged titration of fiber supplementation or miralax  - encouraged squatting position for defecation and pelvic floor relaxation

## 2023-12-22 NOTE — Assessment & Plan Note (Signed)
-   PVR < by bladder scan - pelvic floor myofascial pain on exam - encouraged pelvic floor relaxation and double voiding - repeat if clinical change after trial of Gemtesa

## 2023-12-22 NOTE — Assessment & Plan Note (Addendum)
-   reports dyspareunia in the past with vaginal discharge, sandpaper sensation during applicator use for vaginal estrogen - encouraged pt to apply with finger - For symptomatic vaginal atrophy options include lubrication with a water-based lubricant, personal hygiene measures and barrier protection against wetness, and estrogen replacement in the form of vaginal cream, vaginal tablets, or a time-released vaginal ring.   - Rx low dose vaginal estrogen, patient reports residual Rx at home

## 2023-12-22 NOTE — Assessment & Plan Note (Addendum)
-   started since amlodipine  - avoid fluid intake 3 hours before bedtime - switch amlodipine  dosing to 6-7pm - denies snoring or sleep apnea - trial of Gemtesa

## 2023-12-22 NOTE — Assessment & Plan Note (Signed)
-   reviewed finding on exam - unclear symptoms of urethral caruncle vs. Pelvic organ prolapse - repeat exam after low dose vaginal estrogen - normal PVR and no pain with palpation or bleeding

## 2023-12-22 NOTE — Patient Instructions (Addendum)
 For vaginal atrophy (thinning of the vaginal tissue that can cause dryness and burning) and UTI prevention we discussed estrogen replacement in the form of vaginal cream.   Start vaginal estrogen therapy nightly for two weeks then 2 times weekly at night. This can be placed with your finger or an applicator inside the vagina and around the urethra.  Please let us  know if the prescription is too expensive and we can look for alternative options.   Is vaginal estrogen therapy safe for me? Vaginal estrogen preparations act on the vaginal skin, and only a very tiny amount is absorbed into the bloodstream (0.01%).  They work in a similar way to hand or face cream.  There is minimal absorption and they are therefore perfectly safe. If you have had breast cancer and have persistent troublesome symptoms which aren't settling with vaginal moisturisers and lubricants, local estrogen treatment may be a possibility, but consultation with your oncologist should take place first.   We discussed the symptoms of overactive bladder (OAB), which include urinary urgency, urinary frequency, night-time urination, with or without urge incontinence.  We discussed management including behavioral therapy (decreasing bladder irritants by following a bladder diet, urge suppression strategies, timed voids, bladder retraining), physical therapy, medication; and for refractory cases posterior tibial nerve stimulation, sacral neuromodulation, and intravesical botulinum toxin injection.   For Beta-3 agonist medication, we discussed the potential side effect of elevated blood pressure which is more likely to occur in individuals with uncontrolled hypertension. You were given samples for Gemtesa 75 mg.  It can take a month to start working so give it time, but if you have bothersome side effects call sooner and we can try a different medication.  Call us  if you have trouble filling the prescription or if it's not covered by your  insurance.  For night time frequency: - avoid fluid intake 3 hours before bedtime - switch amlodipine  to 6-7pm  Constipation: Our goal is to achieve formed bowel movements daily or every-other-day.  You may need to try different combinations of the following options to find what works best for you - everybody's body works differently so feel free to adjust the dosages as needed.  Some options to help maintain bowel health include:  Dietary changes (more leafy greens, vegetables and fruits; less processed foods) Fiber supplementation (Benefiber, FiberCon, Metamucil or Psyllium). Start slow and increase gradually to full dose. Over-the-counter agents such as: stool softeners (Docusate or Colace) and/or laxatives (Miralax , milk of magnesia)  "Power Pudding" is a natural mixture that may help your constipation.  To make blend 1 cup applesauce, 1 cup wheat bran, and 3/4 cup prune juice, refrigerate and then take 1 tablespoon daily with a large glass of water as needed.   Women should try to eat at least 21 to 25 grams of fiber a day, while men should aim for 30 to 38 grams a day. You can add fiber to your diet with food or a fiber supplement such as psyllium (metamucil), benefiber, or fibercon.   Here's a look at how much dietary fiber is found in some common foods. When buying packaged foods, check the Nutrition Facts label for fiber content. It can vary among brands.  Fruits Serving size Total fiber (grams)*  Raspberries 1 cup 8.0  Pear 1 medium 5.5  Apple, with skin 1 medium 4.5  Banana 1 medium 3.0  Orange 1 medium 3.0  Strawberries 1 cup 3.0   Vegetables Serving size Total fiber (grams)*  Green peas,  boiled 1 cup 9.0  Broccoli, boiled 1 cup chopped 5.0  Turnip greens, boiled 1 cup 5.0  Brussels sprouts, boiled 1 cup 4.0  Potato, with skin, baked 1 medium 4.0  Sweet corn, boiled 1 cup 3.5  Cauliflower, raw 1 cup chopped 2.0  Carrot, raw 1 medium 1.5   Grains Serving size Total  fiber (grams)*  Spaghetti, whole-wheat, cooked 1 cup 6.0  Barley, pearled, cooked 1 cup 6.0  Bran flakes 3/4 cup 5.5  Quinoa, cooked 1 cup 5.0  Oat bran muffin 1 medium 5.0  Oatmeal, instant, cooked 1 cup 5.0  Popcorn, air-popped 3 cups 3.5  Brown rice, cooked 1 cup 3.5  Bread, whole-wheat 1 slice 2.0  Bread, rye 1 slice 2.0   Legumes, nuts and seeds Serving size Total fiber (grams)*  Split peas, boiled 1 cup 16.0  Lentils, boiled 1 cup 15.5  Black beans, boiled 1 cup 15.0  Baked beans, canned 1 cup 10.0  Chia seeds 1 ounce 10.0  Almonds 1 ounce (23 nuts) 3.5  Pistachios 1 ounce (49 nuts) 3.0  Sunflower kernels 1 ounce 3.0  *Rounded to nearest 0.5 gram. Source: [employer] for [employer]	[place]

## 2023-12-22 NOTE — Assessment & Plan Note (Addendum)
-   urgency > stress - failed oxybutynin and mirabegron 25mg  - mostly night time frequency - We discussed the symptoms of overactive bladder (OAB), which include urinary urgency, urinary frequency, nocturia, with or without urge incontinence.  While we do not know the exact etiology of OAB, several treatment options exist. We discussed management including behavioral therapy (decreasing bladder irritants, urge suppression strategies, timed voids, bladder retraining), physical therapy, medication; for refractory cases posterior tibial nerve stimulation, sacral neuromodulation, and intravesical botulinum toxin injection.  For anticholinergic medications, we discussed the potential side effects of anticholinergics including dry eyes, dry mouth, constipation, cognitive impairment and urinary retention. For Beta-3 agonist medication, we discussed the potential side effect of elevated blood pressure which is more likely to occur in individuals with uncontrolled hypertension. - encouraged low dose vaginal estrogen - encouraged to consider pelvic floor PT due to pelvic floor myofascial pain - trial of Gemtesa with samples and Rx provided - For treatment of stress urinary incontinence,  non-surgical options include expectant management, weight loss, physical therapy, as well as a pessary.  Surgical options include a midurethral sling, Burch urethropexy, and transurethral injection of a bulking agent. - consider repeat trial of mirabegron if no relief from Gemtesa

## 2023-12-24 LAB — URINE CULTURE: Culture: <10000 — AB

## 2023-12-25 LAB — CERVICOVAGINAL ANCILLARY ONLY
Bacterial Vaginitis (gardnerella): NEGATIVE
Candida Glabrata: NEGATIVE
Candida Vaginitis: NEGATIVE
Comment: NEGATIVE
Comment: NEGATIVE
Comment: NEGATIVE

## 2023-12-26 ENCOUNTER — Inpatient Hospital Stay: Payer: Medicare HMO | Attending: Oncology | Admitting: Oncology

## 2024-01-03 ENCOUNTER — Other Ambulatory Visit (HOSPITAL_COMMUNITY): Payer: Self-pay

## 2024-01-08 ENCOUNTER — Ambulatory Visit: Admitting: Obstetrics

## 2024-01-15 ENCOUNTER — Institutional Professional Consult (permissible substitution) (HOSPITAL_BASED_OUTPATIENT_CLINIC_OR_DEPARTMENT_OTHER): Admitting: Internal Medicine

## 2024-01-15 ENCOUNTER — Other Ambulatory Visit (HOSPITAL_COMMUNITY): Payer: Self-pay

## 2024-02-23 ENCOUNTER — Telehealth: Payer: Self-pay | Admitting: Cardiovascular Disease

## 2024-02-23 ENCOUNTER — Other Ambulatory Visit (HOSPITAL_COMMUNITY): Payer: Self-pay

## 2024-02-23 NOTE — Telephone Encounter (Signed)
 Reason for walk-in: Patient has questions about the Repatha  and what she needs to take.   If patient is requesting to be seen today, or if patient is having symptoms:  What symptoms are being reported (if any)?  N/a  Route to triage pool and ensure Teams message has been sent to the Triage [facility] chat.  3.   For medication samples, medication refills, HIM requests, appointment requests, lab-related requests, or form/record drop-off, please route to the appropriate pool. N/a

## 2024-02-23 NOTE — Telephone Encounter (Signed)
 Called patient back about message. Patient wanted to know if she should be on Repatha  and Rosuvastatin . Informed patient that usually no, she should not be on both. Patient wanted to know about zetia . Will send message to the PharmD to clarify. Encouraged patient for now to continue the Zetia , but not to take the rosuvastatin  until she hears back from our office. Patient agreed to plan.

## 2024-02-29 NOTE — Telephone Encounter (Signed)
 Small, Pamela, [facility] to Me   02/27/24 12:19 PM Patient can continue Repatha  and Zetia . Was intolerant to rosuvastatin   Called patient to let her know of the pharmacist advisement. Patient verbalized understanding.

## 2024-03-11 ENCOUNTER — Encounter: Payer: Self-pay | Admitting: Obstetrics

## 2024-03-11 ENCOUNTER — Ambulatory Visit: Admitting: Obstetrics

## 2024-03-11 ENCOUNTER — Other Ambulatory Visit (HOSPITAL_COMMUNITY): Payer: Self-pay

## 2024-03-11 VITALS — BP 144/74 | HR 54

## 2024-03-11 DIAGNOSIS — N362 Urethral caruncle: Secondary | ICD-10-CM

## 2024-03-11 DIAGNOSIS — N3946 Mixed incontinence: Secondary | ICD-10-CM

## 2024-03-11 DIAGNOSIS — R3914 Feeling of incomplete bladder emptying: Secondary | ICD-10-CM | POA: Diagnosis not present

## 2024-03-11 DIAGNOSIS — N952 Postmenopausal atrophic vaginitis: Secondary | ICD-10-CM

## 2024-03-11 DIAGNOSIS — K5900 Constipation, unspecified: Secondary | ICD-10-CM | POA: Diagnosis not present

## 2024-03-11 NOTE — Assessment & Plan Note (Addendum)
-   reviewed finding on prior exam - unclear symptoms of urethral caruncle vs. Pelvic organ prolapse, however pt reports lack of symptoms or vaginal discharge or discomfort after started vaginal estrogen. Stopped due to resolution of symptoms - resume low dose vaginal estrogen - normal PVR and no pain with palpation or bleeding on prior exam, continue to monitor for clinical change

## 2024-03-11 NOTE — Assessment & Plan Note (Signed)
-   urgency > stress - failed oxybutynin and mirabegron 25mg  - Gemtesa  with relief, unknown cost. Pt to call pharmacy to review cost - mostly night time frequency - We discussed the symptoms of overactive bladder (OAB), which include urinary urgency, urinary frequency, nocturia, with or without urge incontinence.  While we do not know the exact etiology of OAB, several treatment options exist. We discussed management including behavioral therapy (decreasing bladder irritants, urge suppression strategies, timed voids, bladder retraining), physical therapy, medication; for refractory cases posterior tibial nerve stimulation, sacral neuromodulation, and intravesical botulinum toxin injection.  For anticholinergic medications, we discussed the potential side effects of anticholinergics including dry eyes, dry mouth, constipation, cognitive impairment and urinary retention. For Beta-3 agonist medication, we discussed the potential side effect of elevated blood pressure which is more likely to occur in individuals with uncontrolled hypertension. - encouraged to resume low dose vaginal estrogen - encouraged to consider pelvic floor PT due to pelvic floor myofascial pain on prior exam and sensation of incomplete emptying with constipation - consider trial of mirabegron if BP improves and optimization of stool consistency - For treatment of stress urinary incontinence,  non-surgical options include expectant management, weight loss, physical therapy, as well as a pessary.  Surgical options include a midurethral sling, Burch urethropexy, and transurethral injection of a bulking agent.

## 2024-03-11 NOTE — Assessment & Plan Note (Addendum)
-   PVR < by bladder scan on prior visit, repeat today 11mL - pelvic floor myofascial pain on prior exam - encouraged pelvic floor relaxation and double voiding - symptoms improved on Gemtesa  - encouraged to optimize stool consistency and consider pelvic floor PT

## 2024-03-11 NOTE — Assessment & Plan Note (Signed)
-   reports dyspareunia in the past with vaginal discharge, sandpaper sensation during applicator use for vaginal estrogen - encouraged pt to apply with finger and resume use - For symptomatic vaginal atrophy options include lubrication with a water-based lubricant, personal hygiene measures and barrier protection against wetness, and estrogen replacement in the form of vaginal cream, vaginal tablets, or a time-released vaginal ring.

## 2024-03-11 NOTE — Patient Instructions (Signed)
 Start miralax  1 capful/day, increase in 3-4 days if you experience constipation symptoms such as bowel movement < 3x/week, straining, sensation of incomplete emptying.   Resume vaginal estrogen 1g twice daily.   Continue squatting position for bowel movements to avoid straining.   Increase your amlodipine  dose as recommended by your primary care provider.   Call your pharmacy to see if Gemtesa  is covered. If not once your blood pressure is better controlled or if bowel movement is improved, call and we will send a prescription for a new overactive bladder medicine.

## 2024-03-11 NOTE — Assessment & Plan Note (Addendum)
-   history of hypothyroidism with 50% hard Type II stool and sensation of incomplete emptying, worsened constipation recently with BM 1x/week and denies fiber supplementation for miralx - For constipation, we reviewed the importance of a better bowel regimen.  We also discussed the importance of avoiding chronic straining, as it can exacerbate her pelvic floor symptoms; we discussed treating constipation and straining prior to surgery, as postoperative straining can lead to damage to the repair and recurrence of symptoms. We discussed initiating therapy with increasing fluid intake, fiber supplementation, stool softeners, and laxatives such as miralax .  - encouraged titration of miralax  with samples provided - encouraged to continue squatting position for defecation and pelvic floor relaxation to avoid straining - discussed need to optimize bowel movements prior to Rx for Trospium

## 2024-03-11 NOTE — Progress Notes (Signed)
 [name] Return Visit  SUBJECTIVE  History of Present Illness: Pamela Small is a 78 y.o. female seen in follow-up for nocturia, vaginal atrophy, urethral caruncle, vaginal pain, mixed urinary incontinence, and constipation. Plan at last visit was low dose vaginal estrogen, titration of fiber supplementation, and gemtesa .   Started Gemtesa  with resolution of vaginal discharge and leakage, discontinued when she ran out of samples. Continues to have sensation of need to void and needing to wait after she sits on commode to void.  Denies calling pharmacy to review cost, reports overall improvement of urinary and vaginal symptoms despite discontinuation of Gemtesa  and vaginal estrogen. Pending to start exercise and walking Using squatty potty, BM 1x/week. Denies starting fiber supplementation or miralax  Reports being stressed about her niece who did not register her child for school.  Needs to increase dose of Amlodipine  pending Rx from pharmacy to be picked up.  Past Medical History: Patient  has a past medical history of Abnormal CT scan, Asthma, Breast cancer (HCC), CAD (coronary artery disease) (10/09/2023), Hyperlipidemia, Hypertension, Lung nodules, Multiple thyroid  nodules, Narrowing of intervertebral disc space, and Numbness of fingers of both hands.   Past Surgical History: She  has a past surgical history that includes Abdominal hysterectomy; Colonoscopy; Polypectomy; Foot arthrodesis, modified Mcbride; Breast lumpectomy with radioactive seed localization (Right, 08/04/2022); and LEFT HEART CATH AND CORONARY ANGIOGRAPHY (N/A, 09/28/2023).   Medications: She has a current medication list which includes the following prescription(s): albuterol , amlodipine , aspirin  ec, atenolol, estradiol , repatha  sureclick, ezetimibe , isosorbide  mononitrate, levothyroxine , multivitamin women, nitroglycerin , and gemtesa .   Allergies: Patient is allergic to guaifenesin.   Social  History: Patient  reports that she has never smoked. She has never used smokeless tobacco. She reports that she does not drink alcohol and does not use drugs.     OBJECTIVE     Physical Exam: Vitals:   03/11/24 0950 03/11/24 0951  BP: (!) 147/71 (!) 151/78  Pulse: 78 72   Gen: No apparent distress, A&O x 3.  Detailed Urogynecologic Evaluation:  Deferred.    Post Void Residual - 03/11/24 1047       Post Void Residual   Post Void Residual 11 mL               ASSESSMENT AND PLAN    Pamela Small is a 78 y.o. with:  1. Vaginal atrophy   2. Urinary incontinence, mixed   3. Constipation, unspecified constipation type   4. Feeling of incomplete bladder emptying   5. Urethral caruncle     Vaginal atrophy Assessment & Plan: - reports dyspareunia in the past with vaginal discharge, sandpaper sensation during applicator use for vaginal estrogen - encouraged pt to apply with finger and resume use - For symptomatic vaginal atrophy options include lubrication with a water-based lubricant, personal hygiene measures and barrier protection against wetness, and estrogen replacement in the form of vaginal cream, vaginal tablets, or a time-released vaginal ring.     Urinary incontinence, mixed Assessment & Plan: - urgency > stress - failed oxybutynin and mirabegron 25mg  - Gemtesa  with relief, unknown cost. Pt to call pharmacy to review cost - mostly night time frequency - We discussed the symptoms of overactive bladder (OAB), which include urinary urgency, urinary frequency, nocturia, with or without urge incontinence.  While we do not know the exact etiology of OAB, several treatment options exist. We discussed management including behavioral therapy (decreasing bladder irritants, urge suppression strategies, timed voids, bladder retraining), physical therapy, medication; for  refractory cases posterior tibial nerve stimulation, sacral neuromodulation, and intravesical botulinum toxin  injection.  For anticholinergic medications, we discussed the potential side effects of anticholinergics including dry eyes, dry mouth, constipation, cognitive impairment and urinary retention. For Beta-3 agonist medication, we discussed the potential side effect of elevated blood pressure which is more likely to occur in individuals with uncontrolled hypertension. - encouraged to resume low dose vaginal estrogen - encouraged to consider pelvic floor PT due to pelvic floor myofascial pain on prior exam and sensation of incomplete emptying with constipation - consider trial of mirabegron if BP improves and optimization of stool consistency - For treatment of stress urinary incontinence,  non-surgical options include expectant management, weight loss, physical therapy, as well as a pessary.  Surgical options include a midurethral sling, Burch urethropexy, and transurethral injection of a bulking agent.   Constipation, unspecified constipation type Assessment & Plan: - history of hypothyroidism with 50% hard Type II stool and sensation of incomplete emptying, worsened constipation recently with BM 1x/week and denies fiber supplementation for miralx - For constipation, we reviewed the importance of a better bowel regimen.  We also discussed the importance of avoiding chronic straining, as it can exacerbate her pelvic floor symptoms; we discussed treating constipation and straining prior to surgery, as postoperative straining can lead to damage to the repair and recurrence of symptoms. We discussed initiating therapy with increasing fluid intake, fiber supplementation, stool softeners, and laxatives such as miralax .  - encouraged titration of miralax  with samples provided - encouraged to continue squatting position for defecation and pelvic floor relaxation to avoid straining - discussed need to optimize bowel movements prior to Rx for Trospium    Feeling of incomplete bladder emptying Assessment &  Plan: - PVR < by bladder scan on prior visit, repeat today 11mL - pelvic floor myofascial pain on prior exam - encouraged pelvic floor relaxation and double voiding - symptoms improved on Gemtesa  - encouraged to optimize stool consistency and consider pelvic floor PT   Urethral caruncle Assessment & Plan: - reviewed finding on prior exam - unclear symptoms of urethral caruncle vs. Pelvic organ prolapse, however pt reports lack of symptoms or vaginal discharge or discomfort after started vaginal estrogen. Stopped due to resolution of symptoms - resume low dose vaginal estrogen - normal PVR and no pain with palpation or bleeding on prior exam, continue to monitor for clinical change   Time spent: I spent 28 minutes dedicated to the care of this patient on the date of this encounter to include pre-visit review of records, face-to-face time with the patient discussing nocturia, vaginal atrophy, urethral caruncle, mixed urinary incontinence, constipation and post visit documentation and ordering medication/ testing.   Lianne ONEIDA Gillis, MD

## 2024-03-19 NOTE — Progress Notes (Signed)
 CARDIOLOGY CONSULT NOTE       Patient ID: Pamela Small MRN: [id number] DOB/AGE: 1946-04-11 78 y.o.   Referring Physician: Delores Primary Physician: Delores Rojelio Caldron, NP Primary Cardiologist: Delford Moat For: Chest pain     HPI:  78 y.o. referred by NP Delores for chest pain. First seen 07/11/23 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   Cardiac CTA done 05/11/12  with calcium  score 58 which is 79 th percentile  with non obstructive CAD read by radiology as 0-25% in LM. Echo in 2016 with normal EF mild MR and MAC Myovue done 10/28/14  normal EF 76%   In regard to her breast cancer she declined XRT and endocrine Rx   No on repatha  and zetia  for her LDL. She is intolerant to statins   She does not work Has son in [place] and daughter in [place] . Has palpitations at night and atypical SSCP sharp pain similar to after she takes statin rest/exertion She has palpitations at night when she lays down She indicates falling out twice while doing house work Thinks her BS got low. She has had tyroid radioactive iodine TSH has been high and not rechecked since her dose adjusted a couple of months ago  PET CT reviewed:  concern for TID 1.38 with abnormal MBFR in LAD with small area of LAD ischemia. Moderate calcium  noted in LAD and RCA  Using nitroglycerin  2 x/ week for pain in chest and dyspnea no rest pain   Cath done 09/28/23 distal LM/ostial LAD 50% medical Rx ASA added by Dr Barbette. Imdur  added 1/2 tab daily Pains sound more like reflux  Primary ordered 14 day monitor for palpitations do not have results She will try and get them for us  No clinical evidence of PAF  ROS All other systems reviewed and negative except as noted above  Past Medical History:  Diagnosis Date   Abnormal CT scan    Asthma    Breast cancer (HCC)    CAD (coronary artery disease) 10/09/2023   CT  04/2012: CAC score 58 (79th percentile), nonobstructive CAD  TTE 10/14/14: EF 60-65, no RWMA, mild MR, PASP 36  Lex MPI 10/28/14: no ischemia  PET MPI 09/05/23: small territory of LAD ischemia, +TID, global MBFR normal LHC 09/28/23: oLM 20, mLM 20; oLAD 50 followed by aneurysmal segment, mLAD 30 >> Med Rx; consider FFR of oLAD if refractory angina     Hyperlipidemia    Hypertension    Lung nodules    seen on CT scan 2013   Multiple thyroid  nodules    US  guided bx was benign in 2013   Narrowing of intervertebral disc space    Numbness of fingers of both hands     Family History  Problem Relation Age of Onset   Hyperlipidemia Mother    Asthma Mother    Diabetes Maternal Grandmother    Breast cancer Daughter    Bladder Cancer Neg Hx    Renal cancer Neg Hx    Uterine cancer Neg Hx     Social History   Socioeconomic History   Marital status: Married    Spouse name: Not on file   Number of children: Not on file   Years of education: Not on file   Highest education level: Not on file  Occupational History   Not on file  Tobacco Use   Smoking status: Never  Smokeless tobacco: Never  Vaping Use   Vaping status: Never Used  Substance and Sexual Activity   Alcohol use: No   Drug use: No   Sexual activity: Not Currently    Partners: Male    Birth control/protection: Post-menopausal  Other Topics Concern   Not on file  Social History Narrative   Not on file   Social Drivers of Health   Financial Resource Strain: Low Risk  (06/15/2022)   Overall Financial Resource Strain (CARDIA)    Difficulty of Paying Living Expenses: Not very hard  Food Insecurity: No Food Insecurity (09/06/2022)   Hunger Vital Sign    Worried About Running Out of Food in the Last Year: Never true    Ran Out of Food in the Last Year: Never true  Transportation Needs: No Transportation Needs (09/06/2022)   PRAPARE - Transportation    Lack of Transportation (Medical): No    Lack of Transportation  (Non-Medical): No  Physical Activity: Not on file  Stress: Not on file  Social Connections: Unknown (05/30/2022)   [facility]   Social Network    Social Network: Not on file  Intimate Partner Violence: Not At Risk (09/06/2022)   Humiliation, Afraid, Rape, and Kick questionnaire    Fear of Current or Ex-Partner: No    Emotionally Abused: No    Physically Abused: No    Sexually Abused: No    Past Surgical History:  Procedure Laterality Date   ABDOMINAL HYSTERECTOMY     BREAST LUMPECTOMY WITH RADIOACTIVE SEED LOCALIZATION Right 08/04/2022   Procedure: RIGHT BREAST LUMPECTOMY WITH RADIOACTIVE SEED LOCALIZATION;  Surgeon: Ebbie Cough, MD;  Location: [facility];  Service: General;  Laterality: Right;   COLONOSCOPY     FOOT ARTHRODESIS, MODIFIED MCBRIDE     LEFT HEART CATH AND CORONARY ANGIOGRAPHY N/A 09/28/2023   Procedure: LEFT HEART CATH AND CORONARY ANGIOGRAPHY;  Surgeon: Verlin Lonni BIRCH, MD;  Location: [facility];  Service: Cardiovascular;  Laterality: N/A;   POLYPECTOMY        Current Outpatient Medications:    albuterol  (VENTOLIN  HFA) 108 (90 Base) MCG/ACT inhaler, Inhale 2 puffs into the lungs every 6 (six) hours as needed for wheezing or shortness of breath., Disp: , Rfl:    amLODipine  (NORVASC ) 10 MG tablet, Take 10 mg by mouth daily., Disp: , Rfl:    aspirin  EC 81 MG tablet, Take 81 mg by mouth daily. Swallow whole., Disp: , Rfl:    atenolol (TENORMIN) 50 MG tablet, Take 50 mg by mouth daily as needed (AFIB)., Disp: , Rfl:    estradiol  (ESTRACE ) 0.1 MG/GM vaginal cream, Place 1/2 gram (0.5g) vaginally every night for two weeks then twice a week thereafter., Disp: 42.5 g, Rfl: 3   Evolocumab  (REPATHA  SURECLICK) 140 MG/ML SOAJ, Inject 140 mg into the skin every 14 (fourteen) days., Disp: 2 mL, Rfl: 11   ezetimibe  (ZETIA ) 10 MG tablet, Take 1 tablet by mouth once daily, Disp: 90 tablet, Rfl: 0   levothyroxine  (SYNTHROID ) 100 MCG  tablet, Take 1 tablet (100 mcg total) by mouth in the morning before any other medications or food., Disp: 90 tablet, Rfl: 3   Multiple Vitamins-Minerals (MULTIVITAMIN WOMEN) TABS, Take 1 tablet by mouth daily., Disp: , Rfl:    nitroGLYCERIN  (NITROSTAT ) 0.4 MG SL tablet, Place 1 tablet (0.4 mg total) under the tongue every 5 (five) minutes as needed for chest pain., Disp: 25 tablet, Rfl: 3   Vibegron  (GEMTESA ) 75 MG  TABS, Take 1 tablet (75 mg total) by mouth daily., Disp: 30 tablet, Rfl: 2   isosorbide  mononitrate (IMDUR ) 30 MG 24 hr tablet, Take 0.5 tablets (15 mg total) by mouth daily. (Patient not taking: Reported on 03/25/2024), Disp: 45 tablet, Rfl: 3    Physical Exam: Blood pressure 138/74, pulse 72, height 5' 2 (1.575 m), weight 154 lb (69.9 kg), SpO2 98%.    Affect appropriate Healthy:  appears stated age HEENT: normal Neck supple with no adenopathy JVP normal no bruits no thyromegaly Lungs clear with no wheezing and good diaphragmatic motion Heart:  S1/S2 no murmur, no rub, gallop or click PMI normal prior right breast lumpectomy Abdomen: benighn, BS positve, no tenderness, no AAA no bruit.  No HSM or HJR Distal pulses intact with no bruits No edema Neuro non-focal Skin warm and dry No muscular weakness   Labs:   Lab Results  Component Value Date   WBC 6.4 09/13/2023   HGB 14.7 09/13/2023   HCT 46.5 09/13/2023   MCV 90 09/13/2023   PLT 263 09/13/2023   No results for input(s): NA, K, CL, CO2, BUN, CREATININE, CALCIUM , PROT, BILITOT, ALKPHOS, ALT, AST, GLUCOSE in the last 168 hours.  Invalid input(s): LABALBU Lab Results  Component Value Date   TROPONINI <0.30 05/10/2012    Lab Results  Component Value Date   CHOL 356 (H) 03/07/2022   CHOL 333 (H) 09/26/2016   CHOL 325 (H) 05/01/2015   Lab Results  Component Value Date   HDL 60 03/07/2022   HDL 53 09/26/2016   HDL 57 05/01/2015   Lab Results  Component Value Date   LDLCALC  282 (H) 03/07/2022   LDLCALC 261 (H) 09/26/2016   LDLCALC 249 (H) 05/01/2015   Lab Results  Component Value Date   TRIG 90 03/07/2022   TRIG 96 09/26/2016   TRIG 93 05/01/2015   Lab Results  Component Value Date   CHOLHDL 5.9 (H) 03/07/2022   CHOLHDL 6.3 (H) 09/26/2016   CHOLHDL 5.7 (H) 05/01/2015   No results found for: LDLDIRECT    Radiology: No results found.   EKG: SR rate 61 normal 08/02/22     ASSESSMENT AND PLAN:   CAD: noted in 2013 with high calcium  score for age. F/U myovue non ischemic in 2016. CRF;s HTN and HLD Now abnormal PET CT with area of LAD ischemia, moderate calcium  in LAD/RCA and more worrisome TID 1.38. CAth 09/28/23 with 50% distal LM/ostial LAD medical Rx Likely reflux symptoms continue 15 mg imdur  HTN:  continue norvasc  and atenolol  HLD:  Intolerant statins now on zetia  and repatha  update labs Thyroid :  on synthroid  replacement with history goiter TSH was 42 03/07/22 Check labs today  Breast Cancer:  f/u Dr Ebbie. Right lumpectomy 07/2022 stage 1 declined endocrine/XRT Rx f/u mammogram per oncology  Palpitations: f/u monitor with primary   Lipid/Liver    F/U f/u in a year    Signed: Maude Emmer 03/25/2024, 9:50 AM

## 2024-03-20 ENCOUNTER — Telehealth: Payer: Self-pay | Admitting: Pharmacist

## 2024-03-20 NOTE — Telephone Encounter (Signed)
 Called pt to remind her about repeat cholesterol labs. LVM

## 2024-03-25 ENCOUNTER — Other Ambulatory Visit: Payer: Self-pay

## 2024-03-25 ENCOUNTER — Ambulatory Visit: Attending: Cardiovascular Disease | Admitting: Cardiovascular Disease

## 2024-03-25 ENCOUNTER — Ambulatory Visit: Payer: Self-pay | Admitting: Cardiovascular Disease

## 2024-03-25 ENCOUNTER — Encounter: Payer: Self-pay | Admitting: Cardiovascular Disease

## 2024-03-25 VITALS — BP 138/74 | HR 72 | Ht 62.0 in | Wt 154.0 lb

## 2024-03-25 DIAGNOSIS — I10 Essential (primary) hypertension: Secondary | ICD-10-CM

## 2024-03-25 DIAGNOSIS — I2510 Atherosclerotic heart disease of native coronary artery without angina pectoris: Secondary | ICD-10-CM

## 2024-03-25 DIAGNOSIS — R002 Palpitations: Secondary | ICD-10-CM

## 2024-03-25 LAB — HEPATIC FUNCTION PANEL
ALT: 12 IU/L (ref 0–32)
AST: 19 IU/L (ref 0–40)
Albumin: 4.3 g/dL (ref 3.8–4.8)
Alkaline Phosphatase: 170 IU/L — ABNORMAL HIGH (ref 44–121)
Bilirubin Total: 0.4 mg/dL (ref 0.0–1.2)
Bilirubin, Direct: 0.14 mg/dL (ref 0.00–0.40)
Total Protein: 6.8 g/dL (ref 6.0–8.5)

## 2024-03-25 LAB — LIPID PANEL
Chol/HDL Ratio: 2.7 ratio (ref 0.0–4.4)
Cholesterol, Total: 172 mg/dL (ref 100–199)
HDL: 63 mg/dL (ref >39)
LDL Chol Calc (NIH): 97 mg/dL (ref 0–99)
Triglycerides: 60 mg/dL (ref 0–149)
VLDL Cholesterol Cal: 12 mg/dL (ref 5–40)

## 2024-03-25 NOTE — Patient Instructions (Signed)
 Medication Instructions:  Your physician recommends that you continue on your current medications as directed. Please refer to the Current Medication list given to you today.  *If you need a refill on your cardiac medications before your next appointment, please call your pharmacy*  Lab Work: Lipids and liver test  Follow-Up: At [facility], you and your health needs are our priority.  As part of our continuing mission to provide you with exceptional heart care, our providers are all part of one team.  This team includes your primary Cardiologist (physician) and Advanced Practice Providers or APPs (Physician Assistants and Nurse Practitioners) who all work together to provide you with the care you need, when you need it.  Your next appointment:   1 year  Provider:   Maude Emmer, MD

## 2024-04-04 ENCOUNTER — Other Ambulatory Visit (HOSPITAL_COMMUNITY): Payer: Self-pay

## 2024-04-04 ENCOUNTER — Telehealth: Payer: Self-pay | Admitting: Cardiovascular Disease

## 2024-04-04 NOTE — Telephone Encounter (Signed)
 Pt dropped off monitor results.   Location: Dr. Delford mailbox

## 2024-04-05 NOTE — Telephone Encounter (Signed)
 Dr. Nishan received and reviewed records. Will have send to scan center to go into chart.

## 2024-05-10 ENCOUNTER — Ambulatory Visit: Payer: Self-pay | Admitting: *Deleted

## 2024-05-29 ENCOUNTER — Other Ambulatory Visit (HOSPITAL_COMMUNITY): Payer: Self-pay

## 2024-06-05 ENCOUNTER — Other Ambulatory Visit (HOSPITAL_COMMUNITY): Payer: Self-pay

## 2024-06-05 MED ORDER — ALBUTEROL SULFATE HFA 108 (90 BASE) MCG/ACT IN AERS
1.0000 | INHALATION_SPRAY | Freq: Four times a day (QID) | RESPIRATORY_TRACT | 3 refills | Status: AC | PRN
  Filled 2024-06-05: qty 20.1, 90d supply, fill #0

## 2024-06-06 ENCOUNTER — Other Ambulatory Visit: Payer: Self-pay

## 2024-06-06 ENCOUNTER — Other Ambulatory Visit (HOSPITAL_COMMUNITY): Payer: Self-pay

## 2024-06-11 ENCOUNTER — Ambulatory Visit: Admitting: Obstetrics

## 2024-06-17 ENCOUNTER — Other Ambulatory Visit (HOSPITAL_COMMUNITY): Payer: Self-pay
